# Patient Record
Sex: Female | Born: 1935 | Race: Black or African American | Hispanic: No | Smoking: Former smoker
Health system: Southern US, Community
[De-identification: ages and names within clinical notes are randomized; demographics above are authoritative.]

## PROBLEM LIST (undated history)

## (undated) DIAGNOSIS — I219 Acute myocardial infarction, unspecified: Secondary | ICD-10-CM

## (undated) DIAGNOSIS — K219 Gastro-esophageal reflux disease without esophagitis: Secondary | ICD-10-CM

## (undated) DIAGNOSIS — C50919 Malignant neoplasm of unspecified site of unspecified female breast: Secondary | ICD-10-CM

## (undated) DIAGNOSIS — Z860100 Personal history of colon polyps, unspecified: Secondary | ICD-10-CM

## (undated) DIAGNOSIS — K5792 Diverticulitis of intestine, part unspecified, without perforation or abscess without bleeding: Secondary | ICD-10-CM

## (undated) DIAGNOSIS — L03319 Cellulitis of trunk, unspecified: Secondary | ICD-10-CM

## (undated) DIAGNOSIS — Z9889 Other specified postprocedural states: Secondary | ICD-10-CM

## (undated) DIAGNOSIS — Z87891 Personal history of nicotine dependence: Secondary | ICD-10-CM

## (undated) DIAGNOSIS — H547 Unspecified visual loss: Secondary | ICD-10-CM

## (undated) DIAGNOSIS — E785 Hyperlipidemia, unspecified: Secondary | ICD-10-CM

## (undated) DIAGNOSIS — I2510 Atherosclerotic heart disease of native coronary artery without angina pectoris: Secondary | ICD-10-CM

## (undated) DIAGNOSIS — L02219 Cutaneous abscess of trunk, unspecified: Secondary | ICD-10-CM

## (undated) DIAGNOSIS — C773 Secondary and unspecified malignant neoplasm of axilla and upper limb lymph nodes: Secondary | ICD-10-CM

## (undated) DIAGNOSIS — I10 Essential (primary) hypertension: Secondary | ICD-10-CM

## (undated) DIAGNOSIS — IMO0002 Reserved for concepts with insufficient information to code with codable children: Secondary | ICD-10-CM

## (undated) DIAGNOSIS — N6009 Solitary cyst of unspecified breast: Secondary | ICD-10-CM

## (undated) HISTORY — DX: Essential (primary) hypertension: I10

## (undated) HISTORY — DX: Diverticulitis of intestine, part unspecified, without perforation or abscess without bleeding: K57.92

## (undated) HISTORY — DX: Other specified postprocedural states: Z98.890

## (undated) HISTORY — PX: BACK SURGERY: SHX140

## (undated) HISTORY — DX: Acute myocardial infarction, unspecified: I21.9

## (undated) HISTORY — DX: Cutaneous abscess of trunk, unspecified: L02.219

## (undated) HISTORY — DX: Personal history of colon polyps, unspecified: Z86.0100

## (undated) HISTORY — DX: Personal history of colonic polyps: Z86.010

## (undated) HISTORY — DX: Solitary cyst of unspecified breast: N60.09

## (undated) HISTORY — PX: COLONOSCOPY: SHX174

## (undated) HISTORY — PX: GLAUCOMA SURGERY: SHX656

## (undated) HISTORY — DX: Atherosclerotic heart disease of native coronary artery without angina pectoris: I25.10

## (undated) HISTORY — DX: Secondary and unspecified malignant neoplasm of axilla and upper limb lymph nodes: C77.3

## (undated) HISTORY — DX: Hyperlipidemia, unspecified: E78.5

## (undated) HISTORY — DX: Cellulitis of trunk, unspecified: L03.319

## (undated) HISTORY — DX: Unspecified visual loss: H54.7

## (undated) HISTORY — DX: Personal history of nicotine dependence: Z87.891

## (undated) HISTORY — PX: ANGIOPLASTY: SHX39

## (undated) HISTORY — DX: Malignant neoplasm of unspecified site of unspecified female breast: C50.919

## (undated) HISTORY — DX: Reserved for concepts with insufficient information to code with codable children: IMO0002

## (undated) HISTORY — DX: Gastro-esophageal reflux disease without esophagitis: K21.9

## (undated) HISTORY — PX: EYE SURGERY: SHX253

---

## 1962-05-29 HISTORY — PX: ABDOMINAL HYSTERECTOMY: SHX81

## 2003-03-16 ENCOUNTER — Encounter: Payer: Self-pay | Admitting: Internal Medicine

## 2004-04-15 ENCOUNTER — Ambulatory Visit: Payer: Self-pay | Admitting: Internal Medicine

## 2004-05-04 ENCOUNTER — Ambulatory Visit: Payer: Self-pay | Admitting: Internal Medicine

## 2004-05-29 HISTORY — PX: COLON SURGERY: SHX602

## 2004-06-03 ENCOUNTER — Ambulatory Visit: Payer: Self-pay | Admitting: Orthopedic Surgery

## 2004-07-05 ENCOUNTER — Inpatient Hospital Stay (HOSPITAL_COMMUNITY): Admission: RE | Admit: 2004-07-05 | Discharge: 2004-07-06 | Payer: Self-pay | Admitting: Neurosurgery

## 2004-07-15 ENCOUNTER — Encounter: Payer: Self-pay | Admitting: Internal Medicine

## 2005-01-11 ENCOUNTER — Ambulatory Visit: Payer: Self-pay | Admitting: Internal Medicine

## 2005-05-16 ENCOUNTER — Ambulatory Visit: Payer: Self-pay | Admitting: Internal Medicine

## 2005-10-13 ENCOUNTER — Ambulatory Visit: Payer: Self-pay | Admitting: Internal Medicine

## 2005-11-21 ENCOUNTER — Ambulatory Visit: Payer: Self-pay | Admitting: Internal Medicine

## 2005-11-23 ENCOUNTER — Encounter: Payer: Self-pay | Admitting: Internal Medicine

## 2005-11-23 ENCOUNTER — Ambulatory Visit: Payer: Self-pay | Admitting: Gastroenterology

## 2006-04-16 ENCOUNTER — Ambulatory Visit: Payer: Self-pay | Admitting: Internal Medicine

## 2006-05-17 ENCOUNTER — Ambulatory Visit: Payer: Self-pay | Admitting: Internal Medicine

## 2006-06-27 ENCOUNTER — Ambulatory Visit: Payer: Self-pay | Admitting: Internal Medicine

## 2006-07-12 ENCOUNTER — Ambulatory Visit: Payer: Self-pay | Admitting: Internal Medicine

## 2006-07-16 ENCOUNTER — Encounter: Payer: Self-pay | Admitting: Internal Medicine

## 2006-10-09 DIAGNOSIS — Z860100 Personal history of colon polyps, unspecified: Secondary | ICD-10-CM | POA: Insufficient documentation

## 2006-10-09 DIAGNOSIS — K5730 Diverticulosis of large intestine without perforation or abscess without bleeding: Secondary | ICD-10-CM | POA: Insufficient documentation

## 2006-10-09 DIAGNOSIS — E119 Type 2 diabetes mellitus without complications: Secondary | ICD-10-CM

## 2006-10-09 DIAGNOSIS — E1129 Type 2 diabetes mellitus with other diabetic kidney complication: Secondary | ICD-10-CM | POA: Insufficient documentation

## 2006-10-09 DIAGNOSIS — K219 Gastro-esophageal reflux disease without esophagitis: Secondary | ICD-10-CM

## 2006-10-09 DIAGNOSIS — I10 Essential (primary) hypertension: Secondary | ICD-10-CM | POA: Insufficient documentation

## 2006-10-09 DIAGNOSIS — D126 Benign neoplasm of colon, unspecified: Secondary | ICD-10-CM

## 2006-10-09 DIAGNOSIS — M5137 Other intervertebral disc degeneration, lumbosacral region: Secondary | ICD-10-CM

## 2006-10-09 DIAGNOSIS — H409 Unspecified glaucoma: Secondary | ICD-10-CM | POA: Insufficient documentation

## 2007-02-15 ENCOUNTER — Ambulatory Visit: Payer: Self-pay | Admitting: Internal Medicine

## 2007-02-15 DIAGNOSIS — E785 Hyperlipidemia, unspecified: Secondary | ICD-10-CM | POA: Insufficient documentation

## 2007-02-18 LAB — CONVERTED CEMR LAB
ALT: 12 units/L (ref 0–35)
CO2: 34 meq/L — ABNORMAL HIGH (ref 19–32)
Cholesterol: 104 mg/dL (ref 0–200)
Creatinine, Ser: 0.9 mg/dL (ref 0.4–1.2)
Hgb A1c MFr Bld: 6.7 % — ABNORMAL HIGH (ref 4.6–6.0)
LDL Cholesterol: 44 mg/dL (ref 0–99)
Phosphorus: 4 mg/dL (ref 2.3–4.6)
Potassium: 4.2 meq/L (ref 3.5–5.1)
Sodium: 143 meq/L (ref 135–145)
Total CHOL/HDL Ratio: 4
Triglycerides: 171 mg/dL — ABNORMAL HIGH (ref 0–149)

## 2007-03-01 ENCOUNTER — Encounter: Payer: Self-pay | Admitting: Internal Medicine

## 2007-03-05 ENCOUNTER — Encounter (INDEPENDENT_AMBULATORY_CARE_PROVIDER_SITE_OTHER): Payer: Self-pay | Admitting: *Deleted

## 2007-03-05 LAB — HM MAMMOGRAPHY: HM Mammogram: NORMAL

## 2007-03-26 ENCOUNTER — Telehealth (INDEPENDENT_AMBULATORY_CARE_PROVIDER_SITE_OTHER): Payer: Self-pay | Admitting: *Deleted

## 2007-05-15 ENCOUNTER — Telehealth (INDEPENDENT_AMBULATORY_CARE_PROVIDER_SITE_OTHER): Payer: Self-pay | Admitting: *Deleted

## 2007-07-18 ENCOUNTER — Telehealth (INDEPENDENT_AMBULATORY_CARE_PROVIDER_SITE_OTHER): Payer: Self-pay | Admitting: *Deleted

## 2007-09-02 ENCOUNTER — Telehealth (INDEPENDENT_AMBULATORY_CARE_PROVIDER_SITE_OTHER): Payer: Self-pay | Admitting: *Deleted

## 2007-10-17 ENCOUNTER — Telehealth (INDEPENDENT_AMBULATORY_CARE_PROVIDER_SITE_OTHER): Payer: Self-pay | Admitting: *Deleted

## 2007-12-09 ENCOUNTER — Telehealth (INDEPENDENT_AMBULATORY_CARE_PROVIDER_SITE_OTHER): Payer: Self-pay | Admitting: *Deleted

## 2007-12-10 ENCOUNTER — Ambulatory Visit: Payer: Self-pay | Admitting: Internal Medicine

## 2007-12-10 DIAGNOSIS — N63 Unspecified lump in unspecified breast: Secondary | ICD-10-CM

## 2007-12-12 LAB — CONVERTED CEMR LAB
BUN: 10 mg/dL (ref 6–23)
Basophils Absolute: 0.1 10*3/uL (ref 0.0–0.1)
Bilirubin, Direct: 0.1 mg/dL (ref 0.0–0.3)
Chloride: 99 meq/L (ref 96–112)
Cholesterol: 105 mg/dL (ref 0–200)
Eosinophils Absolute: 0.1 10*3/uL (ref 0.0–0.7)
Eosinophils Relative: 1.6 % (ref 0.0–5.0)
GFR calc Af Amer: 91 mL/min
GFR calc non Af Amer: 75 mL/min
HCT: 40.2 % (ref 36.0–46.0)
HDL: 27.4 mg/dL — ABNORMAL LOW (ref >39.0)
MCHC: 33.4 g/dL (ref 30.0–36.0)
MCV: 92.2 fL (ref 78.0–100.0)
Microalb Creat Ratio: 8.4 mg/g (ref 0.0–30.0)
Microalb, Ur: 1.9 mg/dL (ref 0.0–1.9)
Monocytes Absolute: 0.4 10*3/uL (ref 0.1–1.0)
Platelets: 231 10*3/uL (ref 150–400)
Potassium: 3.7 meq/L (ref 3.5–5.1)
RDW: 13.3 % (ref 11.5–14.6)
Total Bilirubin: 0.7 mg/dL (ref 0.3–1.2)
Total Protein: 7.4 g/dL (ref 6.0–8.3)
Triglycerides: 162 mg/dL — ABNORMAL HIGH (ref 0–149)

## 2008-01-02 ENCOUNTER — Encounter: Payer: Self-pay | Admitting: Internal Medicine

## 2008-01-14 ENCOUNTER — Encounter: Payer: Self-pay | Admitting: Internal Medicine

## 2008-01-17 ENCOUNTER — Encounter: Payer: Self-pay | Admitting: Internal Medicine

## 2008-01-23 ENCOUNTER — Telehealth: Payer: Self-pay | Admitting: Internal Medicine

## 2008-05-18 ENCOUNTER — Telehealth: Payer: Self-pay | Admitting: Internal Medicine

## 2008-05-28 ENCOUNTER — Encounter: Payer: Self-pay | Admitting: Internal Medicine

## 2008-05-29 DIAGNOSIS — J9692 Respiratory failure, unspecified with hypercapnia: Secondary | ICD-10-CM | POA: Diagnosis present

## 2008-05-29 DIAGNOSIS — I219 Acute myocardial infarction, unspecified: Secondary | ICD-10-CM | POA: Diagnosis present

## 2008-05-29 HISTORY — DX: Acute myocardial infarction, unspecified: I21.9

## 2008-05-29 HISTORY — PX: CORONARY ANGIOPLASTY WITH STENT PLACEMENT: SHX49

## 2008-06-12 ENCOUNTER — Ambulatory Visit: Payer: Self-pay | Admitting: Internal Medicine

## 2008-06-12 DIAGNOSIS — M1990 Unspecified osteoarthritis, unspecified site: Secondary | ICD-10-CM | POA: Insufficient documentation

## 2008-06-15 LAB — CONVERTED CEMR LAB
ALT: 12 units/L (ref 0–35)
AST: 19 units/L (ref 0–37)
Albumin: 4 g/dL (ref 3.5–5.2)
Basophils Relative: 0.7 % (ref 0.0–3.0)
Calcium: 9.8 mg/dL (ref 8.4–10.5)
Cholesterol: 96 mg/dL (ref 0–200)
Eosinophils Relative: 1.7 % (ref 0.0–5.0)
GFR calc non Af Amer: 87 mL/min
Hemoglobin: 13.7 g/dL (ref 12.0–15.0)
Hgb A1c MFr Bld: 6.5 % — ABNORMAL HIGH (ref 4.6–6.0)
LDL Cholesterol: 37 mg/dL (ref 0–99)
Lymphocytes Relative: 52.5 % — ABNORMAL HIGH (ref 12.0–46.0)
Monocytes Relative: 8.5 % (ref 3.0–12.0)
Neutro Abs: 2.3 10*3/uL (ref 1.4–7.7)
Phosphorus: 3.2 mg/dL (ref 2.3–4.6)
RBC: 4.37 M/uL (ref 3.87–5.11)
Sodium: 144 meq/L (ref 135–145)
Total CHOL/HDL Ratio: 3.3

## 2008-07-04 ENCOUNTER — Inpatient Hospital Stay: Payer: Self-pay | Admitting: Internal Medicine

## 2008-07-05 ENCOUNTER — Encounter: Payer: Self-pay | Admitting: Internal Medicine

## 2008-07-06 ENCOUNTER — Encounter: Payer: Self-pay | Admitting: Internal Medicine

## 2008-07-07 ENCOUNTER — Encounter: Payer: Self-pay | Admitting: Internal Medicine

## 2008-07-07 DIAGNOSIS — I2510 Atherosclerotic heart disease of native coronary artery without angina pectoris: Secondary | ICD-10-CM | POA: Insufficient documentation

## 2008-07-14 ENCOUNTER — Ambulatory Visit: Payer: Self-pay | Admitting: Internal Medicine

## 2008-07-14 DIAGNOSIS — N390 Urinary tract infection, site not specified: Secondary | ICD-10-CM | POA: Insufficient documentation

## 2008-07-14 LAB — CONVERTED CEMR LAB
Bilirubin Urine: NEGATIVE
Urobilinogen, UA: 0.2
pH: 8

## 2008-07-21 ENCOUNTER — Encounter: Payer: Self-pay | Admitting: Internal Medicine

## 2008-08-04 ENCOUNTER — Telehealth: Payer: Self-pay | Admitting: Family Medicine

## 2008-08-19 ENCOUNTER — Encounter: Payer: Self-pay | Admitting: Cardiovascular Disease

## 2008-08-27 ENCOUNTER — Encounter: Payer: Self-pay | Admitting: Cardiovascular Disease

## 2008-09-16 ENCOUNTER — Encounter: Payer: Self-pay | Admitting: Internal Medicine

## 2008-09-21 ENCOUNTER — Ambulatory Visit: Payer: Self-pay | Admitting: Internal Medicine

## 2008-09-23 ENCOUNTER — Encounter: Payer: Self-pay | Admitting: Internal Medicine

## 2008-09-26 ENCOUNTER — Encounter: Payer: Self-pay | Admitting: Cardiovascular Disease

## 2008-10-01 ENCOUNTER — Encounter: Payer: Self-pay | Admitting: Internal Medicine

## 2008-10-09 ENCOUNTER — Encounter: Payer: Self-pay | Admitting: Internal Medicine

## 2008-10-30 ENCOUNTER — Telehealth: Payer: Self-pay | Admitting: Internal Medicine

## 2008-12-24 ENCOUNTER — Ambulatory Visit: Payer: Self-pay | Admitting: Internal Medicine

## 2008-12-28 LAB — CONVERTED CEMR LAB
Albumin: 4 g/dL (ref 3.5–5.2)
Basophils Relative: 0.8 % (ref 0.0–3.0)
Bilirubin, Direct: 0 mg/dL (ref 0.0–0.3)
Chloride: 106 meq/L (ref 96–112)
Eosinophils Relative: 2.3 % (ref 0.0–5.0)
HCT: 39.5 % (ref 36.0–46.0)
HDL: 35 mg/dL — ABNORMAL LOW (ref >39.00)
Hemoglobin: 13.4 g/dL (ref 12.0–15.0)
LDL Cholesterol: 52 mg/dL (ref 0–99)
Lymphs Abs: 3.3 10*3/uL (ref 0.7–4.0)
Monocytes Relative: 10.3 % (ref 3.0–12.0)
Neutro Abs: 2 10*3/uL (ref 1.4–7.7)
Phosphorus: 4.3 mg/dL (ref 2.3–4.6)
Potassium: 4.3 meq/L (ref 3.5–5.1)
RBC: 4.39 M/uL (ref 3.87–5.11)
RDW: 13.9 % (ref 11.5–14.6)
Sodium: 142 meq/L (ref 135–145)
Total Bilirubin: 0.9 mg/dL (ref 0.3–1.2)
Total CHOL/HDL Ratio: 3
VLDL: 27.2 mg/dL (ref 0.0–40.0)

## 2009-01-15 ENCOUNTER — Encounter: Payer: Self-pay | Admitting: Internal Medicine

## 2009-01-15 ENCOUNTER — Observation Stay: Payer: Self-pay | Admitting: Internal Medicine

## 2009-01-16 ENCOUNTER — Encounter: Payer: Self-pay | Admitting: Internal Medicine

## 2009-01-19 ENCOUNTER — Encounter: Payer: Self-pay | Admitting: Internal Medicine

## 2009-01-20 ENCOUNTER — Encounter: Payer: Self-pay | Admitting: Internal Medicine

## 2009-01-29 ENCOUNTER — Telehealth: Payer: Self-pay | Admitting: Family Medicine

## 2009-02-05 ENCOUNTER — Ambulatory Visit: Payer: Self-pay | Admitting: Internal Medicine

## 2009-02-09 ENCOUNTER — Encounter: Payer: Self-pay | Admitting: Internal Medicine

## 2009-02-09 ENCOUNTER — Ambulatory Visit: Payer: Self-pay | Admitting: Ophthalmology

## 2009-02-17 ENCOUNTER — Ambulatory Visit: Payer: Self-pay | Admitting: Ophthalmology

## 2009-02-20 ENCOUNTER — Ambulatory Visit: Payer: Self-pay | Admitting: Ophthalmology

## 2009-03-08 ENCOUNTER — Encounter: Payer: Self-pay | Admitting: Internal Medicine

## 2009-04-01 ENCOUNTER — Telehealth: Payer: Self-pay | Admitting: Internal Medicine

## 2009-05-05 ENCOUNTER — Telehealth: Payer: Self-pay | Admitting: Internal Medicine

## 2009-05-29 DIAGNOSIS — H547 Unspecified visual loss: Secondary | ICD-10-CM

## 2009-05-29 HISTORY — PX: BREAST SURGERY: SHX581

## 2009-05-29 HISTORY — DX: Unspecified visual loss: H54.7

## 2009-06-17 LAB — HM DIABETES EYE EXAM

## 2009-06-24 ENCOUNTER — Telehealth: Payer: Self-pay | Admitting: Internal Medicine

## 2009-06-28 ENCOUNTER — Ambulatory Visit: Payer: Self-pay | Admitting: Internal Medicine

## 2009-07-01 LAB — CONVERTED CEMR LAB
ALT: 12 units/L (ref 0–35)
AST: 17 units/L (ref 0–37)
Albumin: 3.9 g/dL (ref 3.5–5.2)
BUN: 9 mg/dL (ref 6–23)
Basophils Absolute: 0 10*3/uL (ref 0.0–0.1)
Basophils Relative: 0.8 % (ref 0.0–3.0)
Bilirubin, Direct: 0.1 mg/dL (ref 0.0–0.3)
CO2: 29 meq/L (ref 19–32)
Chloride: 107 meq/L (ref 96–112)
Eosinophils Absolute: 0.1 10*3/uL (ref 0.0–0.7)
HCT: 40.6 % (ref 36.0–46.0)
HDL: 36.7 mg/dL — ABNORMAL LOW (ref >39.00)
Hemoglobin: 13.1 g/dL (ref 12.0–15.0)
Lymphocytes Relative: 42.9 % (ref 12.0–46.0)
Lymphs Abs: 2.5 10*3/uL (ref 0.7–4.0)
MCHC: 32.3 g/dL (ref 30.0–36.0)
Monocytes Relative: 8.7 % (ref 3.0–12.0)
Neutro Abs: 2.8 10*3/uL (ref 1.4–7.7)
Potassium: 3.8 meq/L (ref 3.5–5.1)
RBC: 4.35 M/uL (ref 3.87–5.11)
RDW: 13.6 % (ref 11.5–14.6)
Total Bilirubin: 0.5 mg/dL (ref 0.3–1.2)
Total CHOL/HDL Ratio: 3
Total Protein: 7.2 g/dL (ref 6.0–8.3)

## 2009-07-28 ENCOUNTER — Ambulatory Visit: Payer: Self-pay | Admitting: Gastroenterology

## 2009-07-28 ENCOUNTER — Encounter: Payer: Self-pay | Admitting: Internal Medicine

## 2009-07-28 LAB — HM COLONOSCOPY

## 2009-08-16 ENCOUNTER — Ambulatory Visit: Payer: Self-pay | Admitting: Internal Medicine

## 2009-08-16 DIAGNOSIS — H9190 Unspecified hearing loss, unspecified ear: Secondary | ICD-10-CM | POA: Insufficient documentation

## 2009-08-23 ENCOUNTER — Telehealth: Payer: Self-pay | Admitting: Internal Medicine

## 2009-10-07 ENCOUNTER — Telehealth: Payer: Self-pay | Admitting: Internal Medicine

## 2009-10-23 ENCOUNTER — Emergency Department: Payer: Self-pay | Admitting: Emergency Medicine

## 2009-10-28 ENCOUNTER — Ambulatory Visit: Payer: Self-pay | Admitting: Family Medicine

## 2009-10-28 DIAGNOSIS — R109 Unspecified abdominal pain: Secondary | ICD-10-CM | POA: Insufficient documentation

## 2009-10-29 ENCOUNTER — Ambulatory Visit: Payer: Self-pay | Admitting: Family Medicine

## 2009-10-29 ENCOUNTER — Telehealth (INDEPENDENT_AMBULATORY_CARE_PROVIDER_SITE_OTHER): Payer: Self-pay | Admitting: *Deleted

## 2009-10-29 LAB — CONVERTED CEMR LAB: Creatinine, Ser: 0.7 mg/dL (ref 0.4–1.2)

## 2009-11-03 ENCOUNTER — Ambulatory Visit: Payer: Self-pay | Admitting: Family Medicine

## 2009-11-03 ENCOUNTER — Encounter: Payer: Self-pay | Admitting: Family Medicine

## 2009-11-25 ENCOUNTER — Encounter: Payer: Self-pay | Admitting: Internal Medicine

## 2009-11-26 ENCOUNTER — Ambulatory Visit: Payer: Self-pay | Admitting: Internal Medicine

## 2009-11-30 LAB — CONVERTED CEMR LAB: Hgb A1c MFr Bld: 6.2 % (ref 4.6–6.5)

## 2009-12-02 ENCOUNTER — Encounter: Payer: Self-pay | Admitting: Internal Medicine

## 2009-12-10 ENCOUNTER — Telehealth: Payer: Self-pay | Admitting: Internal Medicine

## 2009-12-10 ENCOUNTER — Encounter: Payer: Self-pay | Admitting: Internal Medicine

## 2010-01-24 ENCOUNTER — Telehealth: Payer: Self-pay | Admitting: Internal Medicine

## 2010-03-25 ENCOUNTER — Telehealth: Payer: Self-pay | Admitting: Internal Medicine

## 2010-04-26 ENCOUNTER — Encounter: Payer: Self-pay | Admitting: Internal Medicine

## 2010-05-24 ENCOUNTER — Telehealth: Payer: Self-pay | Admitting: Internal Medicine

## 2010-05-29 HISTORY — PX: BREAST BIOPSY: SHX20

## 2010-05-29 HISTORY — PX: MASTECTOMY: SHX3

## 2010-06-10 ENCOUNTER — Other Ambulatory Visit: Payer: Self-pay | Admitting: Internal Medicine

## 2010-06-10 ENCOUNTER — Ambulatory Visit
Admission: RE | Admit: 2010-06-10 | Discharge: 2010-06-10 | Payer: Self-pay | Source: Home / Self Care | Attending: Internal Medicine | Admitting: Internal Medicine

## 2010-06-10 DIAGNOSIS — K5909 Other constipation: Secondary | ICD-10-CM | POA: Insufficient documentation

## 2010-06-10 LAB — TSH: TSH: 0.89 u[IU]/mL (ref 0.35–5.50)

## 2010-06-10 LAB — RENAL FUNCTION PANEL
Albumin: 4 g/dL (ref 3.5–5.2)
BUN: 11 mg/dL (ref 6–23)
CO2: 29 mEq/L (ref 19–32)
Calcium: 9.7 mg/dL (ref 8.4–10.5)
Chloride: 104 mEq/L (ref 96–112)
Creatinine, Ser: 0.7 mg/dL (ref 0.4–1.2)
GFR: 108.34 mL/min (ref >60.00)
Glucose, Bld: 139 mg/dL — ABNORMAL HIGH (ref 70–99)
Phosphorus: 3.1 mg/dL (ref 2.3–4.6)
Potassium: 4.6 mEq/L (ref 3.5–5.1)
Sodium: 142 mEq/L (ref 135–145)

## 2010-06-10 LAB — LIPID PANEL
Cholesterol: 209 mg/dL — ABNORMAL HIGH (ref 0–200)
HDL: 36.7 mg/dL — ABNORMAL LOW (ref >39.00)
Total CHOL/HDL Ratio: 6
Triglycerides: 250 mg/dL — ABNORMAL HIGH (ref 0.0–149.0)
VLDL: 50 mg/dL — ABNORMAL HIGH (ref 0.0–40.0)

## 2010-06-10 LAB — CBC WITH DIFFERENTIAL/PLATELET
Basophils Absolute: 0 10*3/uL (ref 0.0–0.1)
Basophils Relative: 0.5 % (ref 0.0–3.0)
Eosinophils Absolute: 0.1 10*3/uL (ref 0.0–0.7)
Eosinophils Relative: 1.3 % (ref 0.0–5.0)
HCT: 40.4 % (ref 36.0–46.0)
Hemoglobin: 13.6 g/dL (ref 12.0–15.0)
Lymphocytes Relative: 36 % (ref 12.0–46.0)
Lymphs Abs: 3.3 10*3/uL (ref 0.7–4.0)
MCHC: 33.6 g/dL (ref 30.0–36.0)
MCV: 90.7 fl (ref 78.0–100.0)
Monocytes Absolute: 0.4 10*3/uL (ref 0.1–1.0)
Monocytes Relative: 4.3 % (ref 3.0–12.0)
Neutro Abs: 5.2 10*3/uL (ref 1.4–7.7)
Neutrophils Relative %: 57.9 % (ref 43.0–77.0)
Platelets: 215 10*3/uL (ref 150.0–400.0)
RBC: 4.46 Mil/uL (ref 3.87–5.11)
RDW: 15.1 % — ABNORMAL HIGH (ref 11.5–14.6)
WBC: 9 10*3/uL (ref 4.5–10.5)

## 2010-06-10 LAB — HEPATIC FUNCTION PANEL
ALT: 14 U/L (ref 0–35)
AST: 18 U/L (ref 0–37)
Albumin: 4 g/dL (ref 3.5–5.2)
Alkaline Phosphatase: 43 U/L (ref 39–117)
Bilirubin, Direct: 0.1 mg/dL (ref 0.0–0.3)
Total Bilirubin: 0.4 mg/dL (ref 0.3–1.2)
Total Protein: 7.5 g/dL (ref 6.0–8.3)

## 2010-06-10 LAB — HEMOGLOBIN A1C: Hgb A1c MFr Bld: 6.6 % — ABNORMAL HIGH (ref 4.6–6.5)

## 2010-06-10 LAB — LDL CHOLESTEROL, DIRECT: Direct LDL: 131.1 mg/dL

## 2010-06-15 ENCOUNTER — Encounter: Payer: Self-pay | Admitting: Internal Medicine

## 2010-06-28 NOTE — Progress Notes (Signed)
Summary: Rx Hydrocodone/APAP  Phone Note Refill Request Call back at [phone] [facility] on August 23, 2009 9:12 AM  Refills Requested: Medication #1:  HYDROCODONE-ACETAMINOPHEN 5-325 MG TABS take 1 tablet by mouth four times a day as needed pain   Last Refilled: 07/19/2009 Received faxed refill request, please advise   Method Requested: Telephone to Pharmacy Initial call taken by: Linde Gillis CMA Duncan Dull),  August 23, 2009 9:13 AM  Follow-up for Phone Call        okay #60 x 1 Follow-up by: Cindee Salt MD,  August 23, 2009 1:54 PM  Additional Follow-up for Phone Call Additional follow up Details #1::        [phone] Additional Follow-up by: Mervin Hack CMA Duncan Dull),  August 23, 2009 2:19 PM    Prescriptions: HYDROCODONE-ACETAMINOPHEN 5-325 MG TABS (HYDROCODONE-ACETAMINOPHEN) take 1 tablet by mouth four times a day as needed pain  #60 x 1   Entered by:   Mervin Hack CMA (AAMA)   Authorized by:   Cindee Salt MD   Signed by:   Mervin Hack CMA (AAMA) on 08/23/2009   Method used:   Handwritten   RxID:   [id number]

## 2010-06-28 NOTE — Assessment & Plan Note (Signed)
Summary: CHECK HEARING/CLE   Vital Signs:  Patient profile:   77 year old female Weight:      185 pounds Temp:     98.4 degrees F oral Pulse rate:   80 / minute Pulse rhythm:   regular BP sitting:   160 / 80  (left arm) Cuff size:   large  Vitals Entered By: Mervin Hack CMA Duncan Dull) (August 16, 2009 2:35 PM) CC: check hearing? 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  Past Medical History: Reviewed history from 07/06/2008 and no changes required. Colonic polyps, hx of--tubular adenomas Diabetes mellitus, type II Diverticulosis, colon GERD Hypertension Glaucoma Degenerative disc disease Hyperlipidemia Osteoarthritis Coronary artery disease  Past Surgical History: Reviewed history from 06/28/2009 and no changes required. Vaginal hysterectomy 1964 Breast cyst 2001 Glaucoma surgery  01/03-03/03 3 Level lumbar decompressive laminotomies with  foraminotomies 02/06 2/10 Angioplasty/stent-RCA---------Dr Arida Right cataract & glaucoma surgery---Dr Inez Pilgrim  4/10  Family History: Reviewed history from 10/09/2006 and no changes required. Dad died of lung cancer Brother died of kidney cancer Very strong FH of colon cancer  (at least 2 of children)  Social History: Reviewed history from 10/09/2006 and no changes required. Widowed --5 children Retired as hospital dietary supvr Current Smoker Alcohol use-no Forensic psychologist Mom  Review of Systems       No fever no cold symptoms  Physical Exam  General:  alert.  NAD Ears:  R ear normal and L ear normal.   No sig cerumen Weber doesn't lateralize Rhinne shows air > bone conduction bilaterally   Impression & Recommendations:  Problem # 1:  DECREASED HEARING (ICD-389.9) Assessment New doesn't seem to be a major issue at this point the pressure sensation may be from pushing some cerumen in canal discussed  Complete Medication List: 1)  Glipizide 5 Mg Tabs (Glipizide) .... Take one by mouth daily 2)  Simvastatin 40 Mg Tabs (Simvastatin) .... Take one by mouth daily 3)  Travatan 0.004 % Soln (Travoprost) 4)  Hydrocodone-acetaminophen 5-325 Mg Tabs (Hydrocodone-acetaminophen) .... Take 1 tablet by mouth four times a day as needed pain 5)  Plavix 75 Mg Tabs (Clopidogrel bisulfate) .... Take 1 tablet by mouth once daily 6)  Adult Aspirin Ec Low Strength 81 Mg Tbec (Aspirin) .... Take 1 by mouth once daily 7)  Amlodipine Besy-benazepril Hcl 5-20 Mg Caps (Amlodipine besy-benazepril hcl) .... Take one by mouth daily 8)  Fish Oil 1000 Mg Caps (Omega-3 fatty acids) .... Take 1-2 by mouth daily 9)  Metoprolol Tartrate 100 Mg Tabs (Metoprolol tartrate) .... Take 1 by mouth two times a day 10)  Methazolamide 25 Mg Tabs (Methazolamide) .... Take 1 by  mouth once daily 11)  Dorzolamide Hcl 2 % Soln (Dorzolamide hcl) .... Use in left eye before breakfast and dinner. 12)  Combigan 0.2-0.5 % Soln (Brimonidine tartrate-timolol) .Marland Kitchen.. 1 drop in each eye before breakfast and dinner  Patient Instructions: 1)  Keep regular follow up visit  Current Allergies (reviewed today): No known allergies

## 2010-06-28 NOTE — Assessment & Plan Note (Signed)
Summary: 6 MONTH FOLLOW UP   Vital Signs:  Patient profile:   77 year old female Weight:      187 pounds Temp:     97.4 degrees F oral Pulse rate:   64 / minute Pulse rhythm:   regular BP sitting:   142 / 70  (left arm) Cuff size:   large  Vitals Entered By: Lowella Petties CMA (November 26, 2009 11:12 AM) CC: 6 month follow up   History of Present Illness: here with [name] Vision is very poor--can only see shadows Lives wtih daughter  Still having stomach trouble gets "jumpy, sick feeling all the time" certain foods and perhaps her medicine bring it on  Diabetes has been fine not checking due to vision issues No sig hypoglycemic reactions No sores or pain in feet  Heart has been fine No chest pain No SOB No edema  Still troubled with arthritis --mostly in legs uses hydrocodone three times a day usually  Allergies: No Known Drug Allergies  Past History:  Past medical, surgical, family and social histories (including risk factors) reviewed for relevance to current acute and chronic problems.  Past Medical History: Reviewed history from 07/06/2008 and no changes required. Colonic polyps, hx of--tubular adenomas Diabetes mellitus, type II Diverticulosis, colon GERD Hypertension Glaucoma Degenerative disc disease Hyperlipidemia Osteoarthritis Coronary artery disease  Past Surgical History: Reviewed history from 06/28/2009 and no changes required. Vaginal hysterectomy 1964 Breast cyst 2001 Glaucoma surgery  01/03-03/03 3 Level lumbar decompressive laminotomies with  foraminotomies 02/06 2/10 Angioplasty/stent-RCA---------Dr Arida Right cataract & glaucoma surgery---Dr Inez Pilgrim  4/10  Family History: Reviewed history from 10/09/2006 and no changes required. Dad died of lung cancer Brother died of kidney cancer Very strong FH of colon cancer (at least 2 of children)  Social History: Reviewed history from 10/09/2006 and no changes required. 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   Neck:  supple, no masses, no thyromegaly, no carotid bruits, and no cervical lymphadenopathy.   Lungs:  normal respiratory effort and normal breath sounds.   Heart:  normal rate, regular rhythm, no murmur, and no gallop.   Abdomen:  soft and non-tender.   Pulses:  1+ in feet Extremities:  no edema Skin:  no suspicious lesions and no ulcerations.   Psych:  normally interactive, good eye contact, not anxious appearing, and not depressed appearing.    Diabetes Management Exam:    Foot Exam (with socks and/or shoes not present):       Sensory-Pinprick/Light touch:          Left medial foot (L-4): normal          Left dorsal foot (L-5): normal          Left lateral foot (S-1): normal          Right medial foot (L-4): normal          Right dorsal foot (L-5): normal          Right lateral foot (S-1): normal       Inspection:          Left foot: normal          Right foot: normal       Nails:  Left foot: thickened          Right foot: thickened   Impression & Recommendations:  Problem # 1:  ABDOMINAL PAIN OTHER SPECIFIED SITE (ICD-789.09) Assessment Unchanged on going will stop simvastatin in case that is part of the problem in crease ranitidine  Her updated medication list for this problem includes:    Hydrocodone-acetaminophen 5-325 Mg Tabs (Hydrocodone-acetaminophen) .Marland Kitchen... Take 1 tablet by mouth four times a day as needed pain    Adult Aspirin Ec Low Strength 81 Mg Tbec (Aspirin) .Marland Kitchen... Take 1 by mouth once daily  Problem # 2:  DIABETES MELLITUS, TYPE II (ICD-250.00) Assessment: Unchanged  hopefully still has good control  Her updated medication list for this problem includes:    Glipizide 5 Mg  Tabs (Glipizide) .Marland Kitchen... Take one by mouth daily    Amlodipine Besy-benazepril Hcl 5-20 Mg Caps (Amlodipine besy-benazepril hcl) .Marland Kitchen... Take one by mouth daily    Adult Aspirin Ec Low Strength 81 Mg Tbec (Aspirin) .Marland Kitchen... Take 1 by mouth once daily  Labs Reviewed: Creat: 0.7 (10/29/2009)     Last Eye Exam: serious non diabetic eye disease (06/17/2009) Reviewed HgBA1c results: 6.5 (06/28/2009)  6.3 (12/24/2008)  Orders: Venipuncture (62130) TLB-A1C / Hgb A1C (Glycohemoglobin) (83036-A1C)  Problem # 3:  HYPERLIPIDEMIA (ICD-272.4) Assessment: Comment Only will try off statin since LDL was only 34 and could be related to GI problems  The following medications were removed from the medication list:    Simvastatin 40 Mg Tabs (Simvastatin) .Marland Kitchen... Take one by mouth daily  Labs Reviewed: SGOT: 17 (06/28/2009)   SGPT: 12 (06/28/2009)   HDL:36.70 (06/28/2009), 35.00 (12/24/2008)  LDL:34 (06/28/2009), 52 (12/24/2008)  Chol:99 (06/28/2009), 114 (12/24/2008)  Trig:140.0 (06/28/2009), 136.0 (12/24/2008)  Problem # 4:  HYPERTENSION (ICD-401.9) Assessment: Unchanged reasonable control no changes  The following medications were removed from the medication list:    Methazolamide 25 Mg Tabs (Methazolamide) .Marland Kitchen... Take 1 by mouth once daily Her updated medication list for this problem includes:    Amlodipine Besy-benazepril Hcl 5-20 Mg Caps (Amlodipine besy-benazepril hcl) .Marland Kitchen... Take one by mouth daily    Metoprolol Tartrate 100 Mg Tabs (Metoprolol tartrate) .Marland Kitchen... Take 1 by mouth two times a day  BP today: 142/70 Prior BP: 120/80 (10/28/2009)  Labs Reviewed: K+: 3.8 (06/28/2009) Creat: : 0.7 (10/29/2009)   Chol: 99 (06/28/2009)   HDL: 36.70 (06/28/2009)   LDL: 34 (06/28/2009)   TG: 140.0 (06/28/2009)  Problem # 5:  OSTEOARTHRITIS (ICD-715.90) Assessment: Comment Only will approve higher number of hydrocodone  Her updated medication list for this problem includes:    Hydrocodone-acetaminophen  5-325 Mg Tabs (Hydrocodone-acetaminophen) .Marland Kitchen... Take 1 tablet by mouth four times a day as needed pain    Adult Aspirin Ec Low Strength 81 Mg Tbec (Aspirin) .Marland Kitchen... Take 1 by mouth once daily  Problem # 6:  CORONARY ARTERY DISEASE (ICD-414.00) Assessment: Comment Only has been quiet  The following medications were removed from the medication list:    Methazolamide 25 Mg Tabs (Methazolamide) .Marland Kitchen... Take 1 by mouth once daily Her updated medication list for this problem includes:    Plavix 75 Mg Tabs (Clopidogrel bisulfate) .Marland Kitchen... Take 1 tablet by mouth once daily    Amlodipine Besy-benazepril Hcl 5-20 Mg Caps (Amlodipine besy-benazepril hcl) .Marland Kitchen... Take one by mouth daily    Metoprolol Tartrate 100 Mg Tabs (Metoprolol tartrate) .Marland Kitchen... Take 1 by mouth two times a day    Adult Aspirin Ec Low Strength 81 Mg Tbec (Aspirin) .Marland Kitchen... Take 1  by mouth once daily  Complete Medication List: 1)  Glipizide 5 Mg Tabs (Glipizide) .... Take one by mouth daily 2)  Hydrocodone-acetaminophen 5-325 Mg Tabs (Hydrocodone-acetaminophen) .... Take 1 tablet by mouth four times a day as needed pain 3)  Plavix 75 Mg Tabs (Clopidogrel bisulfate) .... Take 1 tablet by mouth once daily 4)  Amlodipine Besy-benazepril Hcl 5-20 Mg Caps (Amlodipine besy-benazepril hcl) .... Take one by mouth daily 5)  Metoprolol Tartrate 100 Mg Tabs (Metoprolol tartrate) .... Take 1 by mouth two times a day 6)  Ranitidine Hcl 300 Mg Tabs (Ranitidine hcl) .Marland Kitchen.. 1 tab by mouth two times a day for stomach trouble 7)  Adult Aspirin Ec Low Strength 81 Mg Tbec (Aspirin) .... Take 1 by mouth once daily 8)  Combigan 0.2-0.5 % Soln (Brimonidine tartrate-timolol) .Marland Kitchen.. 1 drop in each eye before breakfast and dinner 9)  Travatan 0.004 % Soln (Travoprost)  Other Orders: Radiology Referral (Radiology)  Patient Instructions: 1)  Please stop the fish oil and simvastatin.  2)  Please increase the ranitidine to 300mg  two times a day  3)  Please schedule a  follow-up appointment in 6 months .  4)  Schedule your mammogram.  Prescriptions: RANITIDINE HCL 300 MG TABS (RANITIDINE HCL) 1 tab by mouth two times a day for stomach trouble  #60 x 12   Entered and Authorized by:   Cindee Salt MD   Signed by:   Cindee Salt MD on 11/26/2009   Method used:   [facility]        [id number]ail[facility]       [address] [place], [place]  [address]       Ph: [phone] or [phone]       Fax: [phone]   RxID:   [id number] HYDROCODONE-ACETAMINOPHEN 5-325 MG TABS (HYDROCODONE-ACETAMINOPHEN) take 1 tablet by mouth four times a day as needed pain  #120 x 0   Entered and Authorized by:   Cindee Salt MD   Signed by:   Cindee Salt MD on 11/26/2009   Method used:   Print then Give to Patient   RxID:   [id number]   Prior Medications (reviewed today): GLIPIZIDE 5 MG TABS (GLIPIZIDE) Take one by mouth daily HYDROCODONE-ACETAMINOPHEN 5-325 MG TABS (HYDROCODONE-ACETAMINOPHEN) take 1 tablet by mouth four times a day as needed pain PLAVIX 75 MG TABS (CLOPIDOGREL BISULFATE) take 1 tablet by mouth once daily AMLODIPINE BESY-BENAZEPRIL HCL 5-20 MG CAPS (AMLODIPINE BESY-BENAZEPRIL HCL) Take one by mouth daily METOPROLOL TARTRATE 100 MG TABS (METOPROLOL TARTRATE) take 1 by mouth two times a day ADULT ASPIRIN EC LOW STRENGTH 81 MG TBEC (ASPIRIN) take 1 by mouth once daily COMBIGAN 0.2-0.5 % SOLN (BRIMONIDINE TARTRATE-TIMOLOL) 1 drop in each eye before breakfast and dinner TRAVATAN 0.004 %  SOLN (TRAVOPROST)  Current Allergies: No known allergies

## 2010-06-28 NOTE — Progress Notes (Signed)
Summary: refill request for vicodin  Phone Note Refill Request Message from:  Fax from Pharmacy  Refills Requested: Medication #1:  HYDROCODONE-ACETAMINOPHEN 5-325 MG TABS take 1 tablet by mouth four times a day as needed pain   Last Refilled: 11/26/2009 [facility] [address] [facility] on [facility] desk.  Initial call taken by: Lowella Petties CMA,  January 24, 2010 2:36 PM  Follow-up for Phone Call        okay #120 x 0 Follow-up by: Cindee Salt MD,  January 24, 2010 6:22 PM  Additional Follow-up for Phone Call Additional follow up Details #1::        Rx called to pharmacy Additional Follow-up by: DeShannon Smith CMA Duncan Dull),  January 25, 2010 8:27 AM    Prescriptions: HYDROCODONE-ACETAMINOPHEN 5-325 MG TABS (HYDROCODONE-ACETAMINOPHEN) take 1 tablet by mouth four times a day as needed pain  #120 x 0   Entered by:   Mervin Hack CMA (AAMA)   Authorized by:   Cindee Salt MD   Signed by:   Mervin Hack CMA (AAMA) on 01/25/2010   Method used:   Handwritten   RxID:   [id number]

## 2010-06-28 NOTE — Progress Notes (Signed)
Summary: refill request for vicodin  Phone Note Refill Request Message from:  Fax from Pharmacy  Refills Requested: Medication #1:  HYDROCODONE-ACETAMINOPHEN 5-325 MG TABS take 1 tablet by mouth four times a day as needed pain   Last Refilled: 01/25/2010 [facility] [address] [facility]  Initial call taken by: Lowella Petties CMA, AAMA,  March 25, 2010 8:04 AM  Follow-up for Phone Call        Okay #120 x 0 Follow-up by: Cindee Salt MD,  March 25, 2010 8:59 AM  Additional Follow-up for Phone Call Additional follow up Details #1::        Rx faxed to pharmacy Additional Follow-up by: DeShannon Smith CMA Duncan Dull),  March 25, 2010 12:00 PM    Prescriptions: HYDROCODONE-ACETAMINOPHEN 5-325 MG TABS (HYDROCODONE-ACETAMINOPHEN) take 1 tablet by mouth four times a day as needed pain  #120 x 0   Entered by:   Mervin Hack CMA (AAMA)   Authorized by:   Cindee Salt MD   Signed by:   Mervin Hack CMA (AAMA) on 03/25/2010   Method used:   Handwritten   RxID:   [id number]

## 2010-06-28 NOTE — Progress Notes (Signed)
  Phone Note From Other Clinic   Caller: Daughter- Mardelle Matte Summary of Call: Patient had screening MMG on 11/30/2009 at [facility], she was called back for addl views of right breast today 12/10/2009. She was told to contact her Dr for further evaluation of what was seen today by the Radiologist. I had to call  Imaging to request that they send [place] the reports B/C we did not have them received yet. Daughter, Mardelle Matte called to ask you to refer her mom back to Dr Lemar Livings, she had similar problem with the right breast in the past and wants to go back to see him. Told her we would call her next week after we recived the reports.  Initial call taken by: Carlton Adam,  December 10, 2009 3:53 PM  Follow-up for Phone Call        referral made Follow-up by: Cindee Salt MD,  December 11, 2009 5:41 PM

## 2010-06-28 NOTE — Procedures (Signed)
Summary: Colonoscopy by Dr.Paul [facility]  Colonoscopy by Dr.Paul Oh,[facility]   Imported By: Beau Fanny 08/04/2009 10:38:51  _____________________________________________________________________  External Attachment:    Type:   Image     Comment:   External Document  Appended Document: Colonoscopy by Dr.Paul [facility]    Clinical Lists Changes  Observations: Added new observation of COLONOSCOPY: Results: Diverticulosis.   No polyps Dr Bluford Kaufmann (07/28/2009 12:08)       Colonoscopy  Procedure date:  07/28/2009  Findings:      Results: Diverticulosis.   No polyps Dr Bluford Kaufmann

## 2010-06-28 NOTE — Assessment & Plan Note (Signed)
Summary: STOMACH/DLO   Vital Signs:  Patient profile:   77 year old female Height:      65 inches Weight:      183.38 pounds BMI:     30.63 Temp:     98.6 degrees F oral Pulse rate:   60 / minute Pulse rhythm:   regular BP sitting:   120 / 80  (right arm) Cuff size:   large  Vitals Entered By: Linde Gillis CMA Duncan Dull) (October 28, 2009 12:15 PM) CC: stomach   History of Present Illness: 77 yo female new to me with abdominal cramping and pain.  Has a h/o diverticulosis. Over past month, progressively worsening abdominal cramping/pain, nausea.  Feels it particularly after eating.  Usually LLQ but also points to RLQ at times. Is sometimes associated with nausea, but usually is not. Unsure if there is blood in her stool (pt is blind). Has had some increase in bowel movements at times.  Pt is here with daughter who is unable to help with details of her complaint. She does not think she has had fevers. Taking more Hydrocodone because it helps with this pain.  Current Medications (verified): 1)  Glipizide 5 Mg Tabs (Glipizide) .... Take One By Mouth Daily 2)  Simvastatin 40 Mg Tabs (Simvastatin) .... Take One By Mouth Daily 3)  Travatan 0.004 %  Soln (Travoprost) 4)  Hydrocodone-Acetaminophen 5-325 Mg Tabs (Hydrocodone-Acetaminophen) .... Take 1 Tablet By Mouth Four Times A Day As Needed Pain 5)  Plavix 75 Mg Tabs (Clopidogrel Bisulfate) .... Take 1 Tablet By Mouth Once Daily 6)  Adult Aspirin Ec Low Strength 81 Mg Tbec (Aspirin) .... Take 1 By Mouth Once Daily 7)  Amlodipine Besy-Benazepril Hcl 5-20 Mg Caps (Amlodipine Besy-Benazepril Hcl) .... Take One By Mouth Daily 8)  Fish Oil 1000 Mg Caps (Omega-3 Fatty Acids) .... Take 1-2 By Mouth Daily 9)  Metoprolol Tartrate 100 Mg Tabs (Metoprolol Tartrate) .... Take 1 By Mouth Two Times A Day 10)  Methazolamide 25 Mg Tabs (Methazolamide) .... Take 1 By Mouth Once Daily 11)  Dorzolamide Hcl 2 % Soln (Dorzolamide Hcl) .... Use in Left Eye  Before Breakfast and Dinner. 12)  Combigan 0.2-0.5 % Soln (Brimonidine Tartrate-Timolol) .Marland Kitchen.. 1 Drop in Each Eye Before Breakfast and Dinner 13)  Cipro 500 Mg Tabs (Ciprofloxacin Hcl) .Marland Kitchen.. 1 By Mouth 2 Times Daily X 10 Days 14)  Flagyl 500 Mg Tabs (Metronidazole) .Marland Kitchen.. 1 Tab By Mouth Three Times A Day X 10 Days  Allergies (verified): No Known Drug Allergies  Past History:  Past Medical History: Last updated: 07/06/2008 Colonic polyps, hx of--tubular adenomas Diabetes mellitus, type II Diverticulosis, colon GERD Hypertension Glaucoma Degenerative disc disease Hyperlipidemia Osteoarthritis Coronary artery disease  Past Surgical History: Last updated: 06/28/2009 Vaginal hysterectomy 1964 Breast cyst 2001 Glaucoma surgery  01/03-03/03 3 Level lumbar decompressive laminotomies with  foraminotomies 02/06 2/10 Angioplasty/stent-RCA---------Dr Arida Right cataract & glaucoma surgery---Dr Brasington  4/10  Review of Systems      See HPI General:  Denies chills and fever. GI:  Complains of abdominal pain and nausea; denies bloody stools, diarrhea, and vomiting.  Physical Exam  General:  alert.  NAD Abdomen:  soft, mildy tender in RLQ. No rebound or guarding. Psych:  normally interactive, good eye contact, not anxious appearing, and not depressed appearing.     Impression & Recommendations:  Problem # 1:  ABDOMINAL PAIN OTHER SPECIFIED SITE (ICD-789.09) Assessment New Pt is poor historian with progressive symptoms in with known h/o  diverticulosis, will start cipro and flagyl to cover for diverticulitis and send for CT of abdomen/pelvix. Her updated medication list for this problem includes:    Hydrocodone-acetaminophen 5-325 Mg Tabs (Hydrocodone-acetaminophen) .Marland Kitchen... Take 1 tablet by mouth four times a day as needed pain    Adult Aspirin Ec Low Strength 81 Mg Tbec (Aspirin) .Marland Kitchen... Take 1 by mouth once daily  Orders: Radiology Referral (Radiology)  Complete Medication  List: 1)  Glipizide 5 Mg Tabs (Glipizide) .... Take one by mouth daily 2)  Simvastatin 40 Mg Tabs (Simvastatin) .... Take one by mouth daily 3)  Travatan 0.004 % Soln (Travoprost) 4)  Hydrocodone-acetaminophen 5-325 Mg Tabs (Hydrocodone-acetaminophen) .... Take 1 tablet by mouth four times a day as needed pain 5)  Plavix 75 Mg Tabs (Clopidogrel bisulfate) .... Take 1 tablet by mouth once daily 6)  Adult Aspirin Ec Low Strength 81 Mg Tbec (Aspirin) .... Take 1 by mouth once daily 7)  Amlodipine Besy-benazepril Hcl 5-20 Mg Caps (Amlodipine besy-benazepril hcl) .... Take one by mouth daily 8)  Fish Oil 1000 Mg Caps (Omega-3 fatty acids) .... Take 1-2 by mouth daily 9)  Metoprolol Tartrate 100 Mg Tabs (Metoprolol tartrate) .... Take 1 by mouth two times a day 10)  Methazolamide 25 Mg Tabs (Methazolamide) .... Take 1 by mouth once daily 11)  Dorzolamide Hcl 2 % Soln (Dorzolamide hcl) .... Use in left eye before breakfast and dinner. 12)  Combigan 0.2-0.5 % Soln (Brimonidine tartrate-timolol) .Marland Kitchen.. 1 drop in each eye before breakfast and dinner 13)  Cipro 500 Mg Tabs (Ciprofloxacin hcl) .Marland Kitchen.. 1 by mouth 2 times daily x 10 days 14)  Flagyl 500 Mg Tabs (Metronidazole) .Marland Kitchen.. 1 tab by mouth three times a day x 10 days  Patient Instructions: 1)  Please stop by to see Aram Beecham on your way out to set up your CT scan. Prescriptions: HYDROCODONE-ACETAMINOPHEN 5-325 MG TABS (HYDROCODONE-ACETAMINOPHEN) take 1 tablet by mouth four times a day as needed pain  #60 x 0   Entered and Authorized by:   Ruthe Mannan MD   Signed by:   Ruthe Mannan MD on 10/28/2009   Method used:   Print then Give to Patient   RxID:   [id number] FLAGYL 500 MG TABS (METRONIDAZOLE) 1 tab by mouth three times a day x 10 days  #30 x 0   Entered and Authorized by:   Ruthe Mannan MD   Signed by:   Ruthe Mannan MD on 10/28/2009   Method used:   [facility]ron[facility] to        [facility] [phone]* (retail)       [address] [place], [place]  [address]       Ph: [phone] or [phone]       Fax: [phone]   RxID:   [id number] CIPRO 500 MG TABS (CIPROFLOXACIN HCL) 1 by mouth 2 times daily x 10 days  #20 x 0   Entered and Authorized by:   Ruthe Mannan MD   Signed by:   Ruthe Mannan MD on 10/28/2009   Method used:   Electronically to        [facility] [phone]* (retail)       [address] [place], [place]  [address]       Ph: [phone] or [phone]  Fax: [phone]   RxID:   [id number]   Current Allergies (reviewed today): No known allergies

## 2010-06-28 NOTE — Assessment & Plan Note (Signed)
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  Past Medical History: Reviewed history from 07/06/2008 and no changes required. Colonic polyps, hx of--tubular adenomas Diabetes mellitus, type II Diverticulosis, colon GERD Hypertension Glaucoma Degenerative disc disease Hyperlipidemia Osteoarthritis Coronary artery disease  Past Surgical History: Vaginal hysterectomy 1964 Breast cyst 2001 Glaucoma surgery  01/03-03/03 3 Level lumbar decompressive laminotomies with  foraminotomies 02/06 2/10 Angioplasty/stent-RCA---------Dr Arida Right cataract & glaucoma surgery---Dr Inez Pilgrim  4/10  Family History: Reviewed history from 10/09/2006 and no changes required. Dad died of lung cancer Brother died of kidney cancer Very strong FH of colon cancer (at least 2 of children)  Social History: Reviewed history from 10/09/2006 and no changes required. 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 No frequency during day Frustrated with eyes--not overly depressed  Physical Exam  General:  alert.  NAD Neck:  supple, no masses, no thyromegaly, no carotid bruits, and no cervical lymphadenopathy.   Lungs:  normal respiratory effort and normal breath sounds.   Heart:  normal rate, regular rhythm, no murmur, and no gallop.   Pulses:  1+ in feet Extremities:  no edema Skin:  no suspicious lesions and no ulcerations.   Psych:  normally interactive, good eye contact, not anxious appearing, and not depressed appearing.    Diabetes Management Exam:    Foot Exam (with socks and/or shoes not present):       Sensory-Pinprick/Light touch:          Left medial foot (L-4): normal          Left dorsal foot (L-5): normal          Left lateral foot (S-1): normal          Right medial foot (L-4): normal          Right dorsal foot (L-5): normal          Right lateral foot (S-1): normal       Inspection:          Left foot: normal          Right foot: normal       Nails:          Left foot:  normal          Right foot: normal    Eye Exam:       Eye Exam done elsewhere          Date: 06/17/2009          Results: serious non diabetic eye disease          Done by: Dr Inez Pilgrim   Impression & Recommendations:  Problem # 1:  DIABETES MELLITUS, TYPE II (ICD-250.00) Assessment Unchanged seems to be okay will check labs  Her updated medication list for this problem includes:    Glipizide 5 Mg Tabs (Glipizide) .Marland Kitchen... Take one by mouth daily    Adult Aspirin Ec Low Strength 81 Mg Tbec (Aspirin) .Marland Kitchen... Take 1 by mouth once daily    Amlodipine Besy-benazepril Hcl 5-20 Mg Caps (Amlodipine besy-benazepril hcl) .Marland Kitchen... Take one by mouth daily  Orders: TLB-A1C / Hgb A1C (Glycohemoglobin) (83036-A1C) TLB-Hepatic/Liver Function Pnl  (80076-HEPATIC)  Labs Reviewed: Creat: 0.7 (12/24/2008)     Last Eye Exam: serious non diabetic eye disease (06/17/2009) Reviewed HgBA1c results: 6.3 (12/24/2008)  6.5 (06/12/2008)  Problem # 2:  CORONARY ARTERY DISEASE (ICD-414.00) Assessment: Unchanged quiet  no changes needed  Her updated medication list for this problem includes:    Plavix 75 Mg Tabs (Clopidogrel bisulfate) .Marland Kitchen... Take 1 tablet by mouth once daily    Adult Aspirin Ec Low Strength 81 Mg Tbec (Aspirin) .Marland Kitchen... Take 1 by mouth once daily    Amlodipine Besy-benazepril Hcl 5-20 Mg Caps (Amlodipine besy-benazepril hcl) .Marland Kitchen... Take one by mouth daily    Metoprolol Tartrate 100 Mg Tabs (Metoprolol tartrate) .Marland Kitchen... Take 1 by mouth two times a day    Methazolamide 25 Mg Tabs (Methazolamide) .Marland Kitchen... Take 1 by mouth once daily  Problem # 3:  HYPERLIPIDEMIA (ICD-272.4) Assessment: Unchanged  due for labs  Her updated medication list for this problem includes:    Simvastatin 40 Mg Tabs (Simvastatin) .Marland Kitchen... Take one by mouth daily  Labs Reviewed: SGOT: 20 (12/24/2008)   SGPT: 15 (12/24/2008)   HDL:35.00 (12/24/2008), 29.5 (06/12/2008)  LDL:52 (12/24/2008), 37 (06/12/2008)  Chol:114 (12/24/2008), 96 (06/12/2008)  Trig:136.0 (12/24/2008), 146 (06/12/2008)  Orders: TLB-Lipid Panel (80061-LIPID)  Problem # 4:  OSTEOARTHRITIS (ICD-715.90) Assessment: Unchanged ongoing pain but okay with meds  Her updated medication list for this problem includes:    Hydrocodone-acetaminophen 5-325 Mg Tabs (Hydrocodone-acetaminophen) .Marland Kitchen... Take 1 tablet by mouth four times a day as needed pain    Adult Aspirin Ec Low Strength 81 Mg Tbec (Aspirin) .Marland Kitchen... Take 1 by mouth once daily  Complete Medication List: 1)  Glipizide 5 Mg Tabs (Glipizide) .... Take one by mouth daily 2)  Simvastatin 40 Mg Tabs (Simvastatin) .... Take one by mouth daily 3)  Travatan 0.004 % Soln (Travoprost) 4)  Hydrocodone-acetaminophen 5-325 Mg Tabs  (Hydrocodone-acetaminophen) .... Take 1 tablet by mouth four times a day as needed pain 5)  Plavix 75 Mg Tabs (Clopidogrel bisulfate) .... Take 1 tablet by mouth once daily 6)  Adult Aspirin Ec Low Strength 81 Mg Tbec (Aspirin) .... Take 1 by mouth once daily 7)  Amlodipine Besy-benazepril Hcl 5-20 Mg Caps (Amlodipine besy-benazepril hcl) .... Take one by mouth daily 8)  Fish Oil 1000 Mg Caps (Omega-3 fatty acids) .... Take 1-2 by mouth daily 9)  Metoprolol Tartrate 100 Mg Tabs (Metoprolol tartrate) .... Take 1 by mouth two times a day 10)  Methazolamide 25 Mg Tabs (Methazolamide) .... Take 1 by mouth once daily 11)  Dorzolamide Hcl 2 % Soln (Dorzolamide hcl) .... Use in left eye before breakfast and dinner. 12)  Combigan 0.2-0.5 % Soln (Brimonidine tartrate-timolol) .Marland Kitchen.. 1 drop in each eye before breakfast and dinner  Other Orders: TLB-Renal Function Panel (80069-RENAL) TLB-CBC Platelet - w/Differential (85025-CBCD) TLB-TSH (Thyroid Stimulating Hormone) (84443-TSH) Venipuncture ([address])  Patient Instructions: 1)  Please schedule a follow-up appointment in 6 months .   Current Allergies (reviewed today): No known allergies

## 2010-06-28 NOTE — Letter (Signed)
Summary: [facility]   Imported By: Lester [place]town 12/09/2009 12:43:31  _____________________________________________________________________  External Attachment:    Type:   Image     Comment:   External Document

## 2010-06-28 NOTE — Progress Notes (Signed)
Summary: Rx Hydrocodone  Phone Note Refill Request Call back at [phone] Message from:  [facility] on Oct 07, 2009 8:15 AM  Refills Requested: Medication #1:  HYDROCODONE-ACETAMINOPHEN 5-325 MG TABS take 1 tablet by mouth four times a day as needed pain   Last Refilled: 09/22/2009 Received faxed refill request please advise.  Form in your IN box.   Method Requested: Fax to Local Pharmacy Initial call taken by: Linde Gillis CMA Duncan Dull),  Oct 07, 2009 8:16 AM  Follow-up for Phone Call        please call got 2 months supply only 6 weeks ago What is going on? Shenoa Hattabaugh Dia Crawford MD  Oct 07, 2009 1:10 PM   left message with family member to have pt to return my call, pt was sleep. DeShannon Smith CMA Duncan Dull)  Oct 08, 2009 9:24 AM   pt's daughter called back and stated that pt uses hydrocodone 4 times daily and that she had eye surgery so she's in more pain. DeShannon Smith CMA Duncan Dull)  Oct 08, 2009 9:44 AM   She has not been using it that much until recently.  60 has held her for a month If she needs more than 2 a day on a ongoing basis, I will need to see her to review. Okay #60 x 0 Follow-up by: Cindee Salt MD,  Oct 08, 2009 10:52 AM  Additional Follow-up for Phone Call Additional follow up Details #1::        spoke with Daughter and advised results, rx faxed to pharmacy. Additional Follow-up by: Mervin Hack CMA Duncan Dull),  Oct 08, 2009 11:11 AM

## 2010-06-28 NOTE — Progress Notes (Signed)
Summary: CT order  Phone Note From Other Clinic   Caller: [facility]    Call For: Dr. Dayton Martes Summary of Call: Receptionist says that patient is scheduled for CT of the abdomen and pelvis with contrast and they do not have an order.  Perhaps Aram Beecham has not had a chance to do what she needs to do yet.  I'll send this to her as well. Initial call taken by: Delilah Shan CMA (AAMA),  October 29, 2009 1:45 PM  Follow-up for Phone Call        Order was placed so I am not sure. Ruthe Mannan MD  October 29, 2009 1:57 PM   Additional Follow-up for Phone Call Additional follow up Details #1::        A order was faxed  however, I will fax another one.Daine Gip  November 01, 2009 10:47 AM  Additional Follow-up by: Daine Gip,  November 01, 2009 10:47 AM

## 2010-06-28 NOTE — Miscellaneous (Signed)
Summary: Orders Update   Clinical Lists Changes  Orders: Added new Service order of Venipuncture (36415) - Signed Added new Test order of TLB-BMP (Basic Metabolic Panel-BMET) (80048-METABOL) - Signed 

## 2010-06-28 NOTE — Progress Notes (Signed)
Summary: Rx Hydrocodone  Phone Note Refill Request Call back at [phone] Message from:  [facility] on June 24, 2009 1:30 PM  Refills Requested: Medication #1:  HYDROCODONE-ACETAMINOPHEN 5-325 MG TABS take 1 tablet by mouth four times a day as needed pain   Last Refilled: 05/05/2009 Received faxed refill request, please advise   Method Requested: Telephone to Pharmacy Initial call taken by: Linde Gillis CMA Duncan Dull),  June 24, 2009 1:31 PM  Follow-up for Phone Call        Okay #60 x 1 Follow-up by: Cindee Salt MD,  June 24, 2009 2:07 PM  Additional Follow-up for Phone Call Additional follow up Details #1::        Rx called to pharmacy Additional Follow-up by: Mervin Hack CMA Duncan Dull),  June 24, 2009 2:48 PM    Prescriptions: HYDROCODONE-ACETAMINOPHEN 5-325 MG TABS (HYDROCODONE-ACETAMINOPHEN) take 1 tablet by mouth four times a day as needed pain  #60 x 1   Entered by:   Mervin Hack CMA (AAMA)   Authorized by:   Cindee Salt MD   Signed by:   Mervin Hack CMA (AAMA) on 06/24/2009   Method used:   Telephoned to ...       [facility] [phone]* (retail)       [address] [place], [place]  [address]       Ph: [phone] or [phone]       Fax: [phone]   RxID:   [id number]

## 2010-06-30 NOTE — Progress Notes (Signed)
Summary: VICODIN  Phone Note Refill Request Message from:  Fax from Pharmacy on May 24, 2010 10:08 AM  Refills Requested: Medication #1:  HYDROCODONE-ACETAMINOPHEN 5-325 MG TABS take 1 tablet by mouth four times a day as needed pain   Supply Requested: [facility] month [facility] [phone]   Method Requested: Telephone to Pharmacy Initial call taken by: Benny Lennert CMA Duncan Dull),  May 24, 2010 10:09 AM  Follow-up for Phone Call        okay #120 x 0 Follow-up by: Cindee Salt MD,  May 24, 2010 2:00 PM  Additional Follow-up for Phone Call Additional follow up Details #1::        Rx called to pharmacy Additional Follow-up by: Linde Gillis CMA Duncan Dull),  May 24, 2010 3:09 PM    Prescriptions: HYDROCODONE-ACETAMINOPHEN 5-325 MG TABS (HYDROCODONE-ACETAMINOPHEN) take 1 tablet by mouth four times a day as needed pain  #120 x 0   Entered by:   Linde Gillis CMA (AAMA)   Authorized by:   Cindee Salt MD   Signed by:   Linde Gillis CMA (AAMA) on 05/24/2010   Method used:   Telephoned to ...       [facility] [phone]* (retail)       [address] [place], [place]  [address]       Ph: [phone] or [phone]       Fax: [phone]   RxID:   [id number]

## 2010-06-30 NOTE — Assessment & Plan Note (Signed)
Summary: ROA 6 MTHS CYD  R/S FROM 06/03/10   Vital Signs:  Patient profile:   77 year old female Weight:      195 pounds Temp:     98.0 degrees F oral Pulse rate:   72 / minute Pulse rhythm:   regular BP sitting:   153 / 76  (left arm) Cuff size:   large  Vitals Entered By: Mervin Hack CMA Duncan Dull) (June 10, 2010 12:34 PM) CC: 6 month follow-up   History of Present Illness: "I'm okay"---not very enthusiastic Having some stomach trouble Feels "like my butt has shrunk"---feels it when her stools pass. Stools come out in "little gushes" Taking senna but does better with dulcolax  stays cold a lot relates to her blood thinner  Heart is okay No chest pain No SOB No edema  Hasn't been checking sugars No hypoglycemic spells  Allergies: No Known Drug Allergies  Past History:  Past medical, surgical, family and social histories (including risk factors) reviewed for relevance to current acute and chronic problems.  Past Medical History: Reviewed history from 07/06/2008 and no changes required. Colonic polyps, hx of--tubular adenomas Diabetes mellitus, type II Diverticulosis, colon GERD Hypertension Glaucoma Degenerative disc disease Hyperlipidemia Osteoarthritis Coronary artery disease  Past Surgical History: Reviewed history from 06/28/2009 and no changes required. Vaginal hysterectomy 1964 Breast cyst 2001 Glaucoma surgery  01/03-03/03 3 Level lumbar decompressive laminotomies with  foraminotomies 02/06 2/10 Angioplasty/stent-RCA---------Dr Arida Right cataract & glaucoma surgery---Dr Inez Pilgrim  4/10  Family History: Reviewed history from 10/09/2006 and no changes required. Dad died of lung cancer Brother died of kidney cancer Very strong FH of colon cancer (at least 2 of children)  Social History: Reviewed history from 10/09/2006 and no changes required. 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   Neck:  supple, no masses, no thyromegaly, no carotid bruits, and no cervical lymphadenopathy.   Lungs:  normal respiratory effort, no intercostal retractions, no accessory muscle use, and normal breath sounds.   Heart:  normal rate, regular rhythm, no murmur, and no gallop.   Abdomen:  soft and non-tender.   Pulses:  1+ in feet Extremities:  no sig edema Skin:  no suspicious lesions and no ulcerations.   Psych:  normally interactive, good eye contact, not anxious appearing, and not depressed appearing.    Diabetes Management Exam:    Foot Exam (with socks and/or shoes not present):       Sensory-Pinprick/Light touch:          Left medial foot (L-4): normal          Left dorsal foot (L-5): normal          Left lateral foot (S-1): normal          Right medial foot (L-4): normal          Right dorsal foot (L-5): normal          Right lateral foot (S-1): normal       Inspection:          Left foot: normal          Right foot: normal       Nails:          Left foot: thickened  Right foot: thickened   Impression & Recommendations:  Problem # 1:  CONSTIPATION, CHRONIC (ICD-564.09) Assessment Unchanged probably explains her pain and early satiety will have her add miralax to vegetable laxative  Her updated medication list for this problem includes:    Miralax Powd (Polyethylene glycol 3350) .Marland Kitchen... 1 capful daily with full glass of water    Senokot S 8.6-50 Mg Tabs (Sennosides-docusate sodium) .Marland Kitchen... 2 tabs daily or two times a day for constipation  Problem # 2:  DIABETES MELLITUS, TYPE II (ICD-250.00) Assessment: Unchanged  doesn't check  will do labs  Her updated medication list for this problem includes:    Glipizide 5 Mg Tabs (Glipizide) .Marland Kitchen... Take one by mouth daily    Amlodipine Besy-benazepril  Hcl 5-20 Mg Caps (Amlodipine besy-benazepril hcl) .Marland Kitchen... Take one by mouth daily    Adult Aspirin Ec Low Strength 81 Mg Tbec (Aspirin) .Marland Kitchen... Take 1 by mouth once daily  Labs Reviewed: Creat: 0.7 (10/29/2009)     Last Eye Exam: serious non diabetic eye disease (06/17/2009) Reviewed HgBA1c results: 6.2 (11/26/2009)  6.5 (06/28/2009)  Orders: TLB-A1C / Hgb A1C (Glycohemoglobin) (83036-A1C)  Problem # 3:  HYPERTENSION (ICD-401.9) Assessment: Unchanged  BP is up some will hold off on changes for now  Her updated medication list for this problem includes:    Amlodipine Besy-benazepril Hcl 5-20 Mg Caps (Amlodipine besy-benazepril hcl) .Marland Kitchen... Take one by mouth daily    Metoprolol Tartrate 100 Mg Tabs (Metoprolol tartrate) .Marland Kitchen... Take 1 by mouth two times a day  BP today: 153/76 Prior BP: 142/70 (11/26/2009)  Labs Reviewed: K+: 3.8 (06/28/2009) Creat: : 0.7 (10/29/2009)   Chol: 99 (06/28/2009)   HDL: 36.70 (06/28/2009)   LDL: 34 (06/28/2009)   TG: 140.0 (06/28/2009)  Orders: TLB-Renal Function Panel (80069-RENAL) TLB-CBC Platelet - w/Differential (85025-CBCD) TLB-TSH (Thyroid Stimulating Hormone) (84443-TSH) Venipuncture (98119)  Problem # 4:  HYPERLIPIDEMIA (ICD-272.4) Assessment: Comment Only  off statin in case it was related to stomach trouble if that improves, consider restarting  Labs Reviewed: SGOT: 17 (06/28/2009)   SGPT: 12 (06/28/2009)   HDL:36.70 (06/28/2009), 35.00 (12/24/2008)  LDL:34 (06/28/2009), 52 (12/24/2008)  Chol:99 (06/28/2009), 114 (12/24/2008)  Trig:140.0 (06/28/2009), 136.0 (12/24/2008)  Orders: TLB-Lipid Panel (80061-LIPID) TLB-Hepatic/Liver Function Pnl (80076-HEPATIC)  Problem # 5:  CORONARY ARTERY DISEASE (ICD-414.00) Assessment: Unchanged seems to be quiet  Her updated medication list for this problem includes:    Plavix 75 Mg Tabs (Clopidogrel bisulfate) .Marland Kitchen... Take 1 tablet by mouth once daily    Amlodipine Besy-benazepril Hcl 5-20 Mg Caps  (Amlodipine besy-benazepril hcl) .Marland Kitchen... Take one by mouth daily    Metoprolol Tartrate 100 Mg Tabs (Metoprolol tartrate) .Marland Kitchen... Take 1 by mouth two times a day    Adult Aspirin Ec Low Strength 81 Mg Tbec (Aspirin) .Marland Kitchen... Take 1 by mouth once daily  Problem # 6:  OSTEOARTHRITIS (ICD-715.90) Assessment: Unchanged needs two times a day meds usually  Her updated medication list for this problem includes:    Hydrocodone-acetaminophen 5-325 Mg Tabs (Hydrocodone-acetaminophen) .Marland Kitchen... Take 1 tablet by mouth four times a day as needed pain    Adult Aspirin Ec Low Strength 81 Mg Tbec (Aspirin) .Marland Kitchen... Take 1 by mouth once daily  Complete Medication List: 1)  Glipizide 5 Mg Tabs (Glipizide) .... Take one by mouth daily 2)  Hydrocodone-acetaminophen 5-325 Mg Tabs (Hydrocodone-acetaminophen) .... Take 1 tablet by mouth four times a day as needed pain 3)  Plavix 75 Mg Tabs (Clopidogrel bisulfate) .... Take 1 tablet by mouth once  daily 4)  Amlodipine Besy-benazepril Hcl 5-20 Mg Caps (Amlodipine besy-benazepril hcl) .... Take one by mouth daily 5)  Metoprolol Tartrate 100 Mg Tabs (Metoprolol tartrate) .... Take 1 by mouth two times a day 6)  Ranitidine Hcl 300 Mg Tabs (Ranitidine hcl) .Marland Kitchen.. 1 tab by mouth two times a day for stomach trouble 7)  Adult Aspirin Ec Low Strength 81 Mg Tbec (Aspirin) .... Take 1 by mouth once daily 8)  Combigan 0.2-0.5 % Soln (Brimonidine tartrate-timolol) .Marland Kitchen.. 1 drop in each eye before breakfast and dinner 9)  Travatan 0.004 % Soln (Travoprost) .Marland Kitchen.. 1 drop in each eye two times a day 10)  Dorzolamide Hcl 2 % Soln (Dorzolamide hcl) .Marland Kitchen.. 1 drop in each eye two times a day 11)  Miralax Powd (Polyethylene glycol 3350) .Marland Kitchen.. 1 capful daily with full glass of water 12)  Senokot S 8.6-50 Mg Tabs (Sennosides-docusate sodium) .... 2 tabs daily or two times a day for constipation  Patient Instructions: 1)  Please start miralax daily to open up your bowels. Continue senna-s 2)  Please try the  miralax  ~3 times daily for the next 2-3 days till really cleaned out 3)  Please schedule a follow-up appointment in 6 months .    Orders Added: 1)  TLB-Lipid Panel [80061-LIPID] 2)  TLB-Hepatic/Liver Function Pnl [80076-HEPATIC] 3)  TLB-Renal Function Panel [80069-RENAL] 4)  TLB-CBC Platelet - w/Differential [85025-CBCD] 5)  TLB-TSH (Thyroid Stimulating Hormone) [84443-TSH] 6)  Venipuncture [36415] 7)  TLB-A1C / Hgb A1C (Glycohemoglobin) [83036-A1C]    Current Allergies (reviewed today): No known allergies

## 2010-07-01 NOTE — Miscellaneous (Signed)
Summary: Controlled Substance Agreement  Controlled Substance Agreement   Imported By: Lanelle Bal 11/04/2009 09:12:57  _____________________________________________________________________  External Attachment:    Type:   Image     Comment:   External Document

## 2010-07-06 NOTE — Letter (Signed)
Summary: [facility]  [facility]   Imported By: Kassie Mends 07/01/2010 10:50:55  _____________________________________________________________________  External Attachment:    Type:   Image     Comment:   External Document

## 2010-07-08 ENCOUNTER — Telehealth: Payer: Self-pay | Admitting: Internal Medicine

## 2010-07-12 ENCOUNTER — Encounter: Payer: Self-pay | Admitting: Internal Medicine

## 2010-07-14 NOTE — Progress Notes (Signed)
Summary: refill request for vicodin  Phone Note Refill Request Message from:  Fax from Pharmacy  Refills Requested: Medication #1:  HYDROCODONE-ACETAMINOPHEN 5-325 MG TABS take 1 tablet by mouth four times a day as needed pain   Last Refilled: 05/24/2010 [facility] [address] [facility] on your desk.  Initial call taken by: Lowella Petties CMA, AAMA,  July 08, 2010 8:10 AM  Follow-up for Phone Call        okay #120 x 0 Follow-up by: Cindee Salt MD,  July 08, 2010 1:42 PM  Additional Follow-up for Phone Call Additional follow up Details #1::        Rx faxed to pharmacy Additional Follow-up by: DeShannon Katrinka Blazing CMA Duncan Dull),  July 08, 2010 2:23 PM    Prescriptions: HYDROCODONE-ACETAMINOPHEN 5-325 MG TABS (HYDROCODONE-ACETAMINOPHEN) take 1 tablet by mouth four times a day as needed pain  #120 x 0   Entered by:   Mervin Hack CMA (AAMA)   Authorized by:   Cindee Salt MD   Signed by:   Mervin Hack CMA (AAMA) on 07/08/2010   Method used:   Handwritten   RxID:   [id number]

## 2010-07-18 ENCOUNTER — Encounter: Payer: Self-pay | Admitting: Internal Medicine

## 2010-07-22 ENCOUNTER — Encounter: Payer: Self-pay | Admitting: Internal Medicine

## 2010-07-26 NOTE — Letter (Signed)
Summary: [facility]  [facility]   Imported By: Kassie Mends 07/19/2010 09:30:55  _____________________________________________________________________  External Attachment:    Type:   Image     Comment:   External Document  Appended Document: [facility] left breast biopsy done

## 2010-08-04 NOTE — Letter (Signed)
Summary: Dr. Lemar Livings visit note  Dr. Lemar Livings visit note   Imported By: Kassie Mends 07/27/2010 08:59:28  _____________________________________________________________________  External Attachment:    Type:   Image     Comment:   External Document  Appended Document: Dr. Lemar Livings visit note DCIS on breast biopsy will arrange follow up to plan reexcision

## 2010-08-09 ENCOUNTER — Telehealth: Payer: Self-pay | Admitting: Internal Medicine

## 2010-08-09 NOTE — Letter (Signed)
Summary: [facility]  [facility]   Imported By: Kassie Mends 08/01/2010 09:53:06  _____________________________________________________________________  External Attachment:    Type:   Image     Comment:   External Document  Appended Document:  [facility] right [facility]--planning reexcision to negative margins then RT

## 2010-08-16 NOTE — Progress Notes (Signed)
Summary: refill request for vicodin  Phone Note Refill Request Message from:  Fax from Pharmacy  Refills Requested: Medication #1:  HYDROCODONE-ACETAMINOPHEN 5-325 MG TABS take 1 tablet by mouth four times a day as needed pain   Last Refilled: 07/08/2010 [facility] [address] [facility] on [facility] desk.  Initial call taken by: Lowella Petties CMA, AAMA,  August 09, 2010 8:19 AM  Follow-up for Phone Call        please update order 1 up to four times daily  #120 x 0 Follow-up by: Cindee Salt MD,  August 09, 2010 9:10 AM  Additional Follow-up for Phone Call Additional follow up Details #1::        Rx called to pharmacy, med updated Additional Follow-up by: DeShannon Katrinka Blazing CMA Duncan Dull),  August 09, 2010 11:10 AM    New/Updated Medications: HYDROCODONE-ACETAMINOPHEN 5-325 MG TABS (HYDROCODONE-ACETAMINOPHEN) Take 1 by mouth up to four times daily Prescriptions: HYDROCODONE-ACETAMINOPHEN 5-325 MG TABS (HYDROCODONE-ACETAMINOPHEN) Take 1 by mouth up to four times daily  #120 x 0   Entered by:   Mervin Hack CMA (AAMA)   Authorized by:   Cindee Salt MD   Signed by:   Mervin Hack CMA (AAMA) on 08/09/2010   Method used:   Telephoned to ...       [facility] [phone]* (retail)       [address] [place], [place]  [address]       Ph: [phone] or [phone]       Fax: [phone]   RxID:   [id number]

## 2010-08-24 ENCOUNTER — Ambulatory Visit: Payer: Self-pay | Admitting: General Surgery

## 2010-08-28 ENCOUNTER — Ambulatory Visit: Payer: Self-pay | Admitting: Internal Medicine

## 2010-08-28 DIAGNOSIS — C50919 Malignant neoplasm of unspecified site of unspecified female breast: Secondary | ICD-10-CM

## 2010-08-28 HISTORY — DX: Malignant neoplasm of unspecified site of unspecified female breast: C50.919

## 2010-08-28 HISTORY — PX: MASTECTOMY: SHX3

## 2010-08-30 ENCOUNTER — Ambulatory Visit: Payer: Self-pay | Admitting: General Surgery

## 2010-09-13 ENCOUNTER — Encounter: Payer: Self-pay | Admitting: Internal Medicine

## 2010-09-19 ENCOUNTER — Ambulatory Visit: Payer: Self-pay | Admitting: Internal Medicine

## 2010-09-20 LAB — PATHOLOGY REPORT

## 2010-09-22 ENCOUNTER — Other Ambulatory Visit: Payer: Self-pay | Admitting: *Deleted

## 2010-09-22 NOTE — Telephone Encounter (Signed)
Dr.Letvak patient, pt is due for refill, please advise if refill ok

## 2010-09-23 MED ORDER — HYDROCODONE-ACETAMINOPHEN 5-325 MG PO TABS: ORAL_TABLET | ORAL | Status: DC

## 2010-09-23 NOTE — Telephone Encounter (Signed)
rx called to pharmacy 

## 2010-09-27 ENCOUNTER — Ambulatory Visit: Payer: Self-pay | Admitting: Radiation Oncology

## 2010-09-27 ENCOUNTER — Ambulatory Visit: Payer: Self-pay | Admitting: Internal Medicine

## 2010-10-14 NOTE — Op Note (Signed)
Jessica Nash, Jessica Nash                 ACCOUNT NO.:  [id number]   MEDICAL RECORD NO.:  [id number]          PATIENT TYPE:  INP   LOCATION:  NA                           FACILITY:  [facility]   PHYSICIAN:  Henry A. Pool, M.D.    DATE OF BIRTH:  17-Nov-1932   DATE OF PROCEDURE:  07/05/2004  DATE OF DISCHARGE:                                 OPERATIVE REPORT   PREOPERATIVE DIAGNOSES:  Left L3-4, left L4-5, and left L5-S1 stenosis with  radiculopathy.   POSTOPERATIVE DIAGNOSES:  Left L3-4, left L4-5, and left L5-S1 stenosis with  radiculopathy.   PROCEDURES:  Left L3-4, L4-5, and L5-S1 decompressive laminotomies with  foraminotomies.   SURGEON:  Kathaleen Maser. Pool, M.D.   ASSISTANT:  Reinaldo Meeker, M.D.   ANESTHESIA:  General endotracheal anesthesia.   INDICATIONS:  [name] is a 76 year old female, with a history of severe  left lower extremity pain, consistent with a mixed left-sided lumbar  radiculopathy. Workup has demonstrated evidence of marked spondylosis with  stenosis at L3-4, L4-5, and L5-S1. The patient has no right-sided symptoms.  She has minimal back pain. She presents now three-level decompressive  laminotomy on the left side, in hopes of improving her symptoms.   DESCRIPTION OF PROCEDURE:  The patient taken to the operating room, placed  in supine position. After an adequate level anesthesia was achieved, the  patient was turned prone on the Wilson frame, appropriately padded,  __________. She was prepped and draped sterilely.   The 10 blade used to make a linear incision overlying the L3-4, 5, and S1  levels. This was carried down sharply in the midline. A subperiosteal  dissection was performed, exposing the lamina and facet joints of L3-4, 5,  and S1. Deep self-retaining retractor placed. Intraoperative x-rays taken  and levels were confirmed. Decompressive laminotomies were then performed at  L3-4, L4-5, and L5-S1 using the high-speed drill and Kerrison rongeurs to  debride the inferior aspects of the laminae above, the medial aspect of the  facet joint, and the superior aspect of the laminae below. Ligamentum flavum  was then elevated and resected _________ using Kerrison rongeurs. Underlying  thecal sac and exiting nerve roots were identified. Wide-decompressive  foraminotomies along the course of the exiting nerve roots were performed at  all three levels. There was no evidence of complications. There was no  evidence of injury to thecal sac or nerve roots.   The wound was then irrigated with antibiotic solution. Gelfoam was placed  topically and hemostasis was found to be good. The microscope and retractor  system were removed. Hemostasis was achieved with electrocautery. The wounds  were closed in layers with Vicryl sutures. Steri-Strips and sterile  dressings were applied.   There were no operative complications. The patient tolerated this well and  she was returned to recovery postoperatively.      HAP/MEDQ  D:  07/05/2004  T:  07/05/2004  Job:  [id number]

## 2010-10-28 ENCOUNTER — Ambulatory Visit: Payer: Self-pay | Admitting: Radiation Oncology

## 2010-10-28 ENCOUNTER — Ambulatory Visit: Payer: Self-pay | Admitting: Internal Medicine

## 2010-11-11 ENCOUNTER — Other Ambulatory Visit: Payer: Self-pay | Admitting: *Deleted

## 2010-11-11 MED ORDER — HYDROCODONE-ACETAMINOPHEN 5-325 MG PO TABS: ORAL_TABLET | ORAL | Status: DC

## 2010-11-11 NOTE — Telephone Encounter (Signed)
[facility] 

## 2010-12-01 ENCOUNTER — Encounter: Payer: Self-pay | Admitting: Internal Medicine

## 2010-12-02 ENCOUNTER — Other Ambulatory Visit: Payer: Self-pay | Admitting: Internal Medicine

## 2010-12-05 ENCOUNTER — Encounter: Payer: Self-pay | Admitting: Internal Medicine

## 2010-12-05 ENCOUNTER — Ambulatory Visit (INDEPENDENT_AMBULATORY_CARE_PROVIDER_SITE_OTHER): Payer: PRIVATE HEALTH INSURANCE | Admitting: Internal Medicine

## 2010-12-05 DIAGNOSIS — E119 Type 2 diabetes mellitus without complications: Secondary | ICD-10-CM

## 2010-12-05 DIAGNOSIS — C50919 Malignant neoplasm of unspecified site of unspecified female breast: Secondary | ICD-10-CM | POA: Insufficient documentation

## 2010-12-05 DIAGNOSIS — I2510 Atherosclerotic heart disease of native coronary artery without angina pectoris: Secondary | ICD-10-CM

## 2010-12-05 DIAGNOSIS — I10 Essential (primary) hypertension: Secondary | ICD-10-CM

## 2010-12-05 NOTE — Assessment & Plan Note (Signed)
Right mastectomy RT to other lesion on left Seems to be healing okay--though slowly

## 2010-12-05 NOTE — Assessment & Plan Note (Signed)
Recent arm symptoms provoked stress test by Dr Welton Flakes Awaiting results

## 2010-12-05 NOTE — Assessment & Plan Note (Signed)
BP Readings from Last 3 Encounters:  12/05/10 148/80  06/10/10 153/76  11/26/09 142/70   Good control in general No changes Lab Results  Component Value Date   CREATININE 0.7 06/10/2010

## 2010-12-05 NOTE — Progress Notes (Signed)
Subjective:    Patient ID: Jessica Nash, female    DOB: 1933/05/03, 77 y.o.   MRN: [id number]  HPI Here with daughter Had right breast cancer diagnosed----mastectomy and then RT Still healing over from this Rx and has drainage. Doesn't look good  Emotionally has been okay Did have positive lymph nodes No current chemo planned  Checking sugars only when in hospital Is blind--can't check herself No hypoglycemic reactions  No chest pain No SOB No apparent ankle swelling  Current Outpatient Prescriptions on File Prior to Visit  Medication Sig Dispense Refill  . amLODipine-benazepril (LOTREL) 5-20 MG per capsule Take 1 capsule by mouth daily.        Marland Kitchen aspirin 81 MG tablet Take 81 mg by mouth daily.        . brimonidine-timolol (COMBIGAN) 0.2-0.5 % ophthalmic solution Place 1 drop into both eyes every 12 (twelve) hours.        . clopidogrel (PLAVIX) 75 MG tablet Take 75 mg by mouth daily.        . dorzolamide (TRUSOPT) 2 % ophthalmic solution Place 1 drop into both eyes 2 (two) times daily.        Marland Kitchen glipiZIDE (GLUCOTROL) 5 MG tablet Take 5 mg by mouth daily.        Marland Kitchen HYDROcodone-acetaminophen (NORCO) 5-325 MG per tablet Take 1 by mouth up to four times daily  120 tablet  0  . metoprolol (LOPRESSOR) 100 MG tablet Take 100 mg by mouth 2 (two) times daily.        . polyethylene glycol powder (MIRALAX) powder Take 17 g by mouth daily.        . ranitidine (ZANTAC) 300 MG tablet 1 TAB BY MOUTH TWO TIMES A DAY FOR STOMACH TROUBLE  60 tablet  12  . sennosides-docusate sodium (SENOKOT-S) 8.6-50 MG tablet Take 1 tablet by mouth daily.        . travoprost, benzalkonium, (TRAVATAN) 0.004 % ophthalmic solution Place 1 drop into both eyes 2 (two) times daily.          No Known Allergies  Past Medical History  Diagnosis Date  . Breast cancer metastasized to axillary lymph node 4/12    Papillary carcinoma. 2/3 sentinel nodes positive. Dr Lemar Livings  . Hx of colonic polyps   . Diabetes mellitus   .  Diverticulitis   . GERD (gastroesophageal reflux disease)   . Hypertension   . Glaucoma   . Degenerative disc disease   . Hyperlipidemia   . CAD (coronary artery disease)   . Breast cyst   . Hx of decompressive lumbar laminectomy     3 Level lumbar decompressive laminotomies with  foraminotomies 02/06  . Breast cancer     Past Surgical History  Procedure Date  . Abdominal hysterectomy   . Glaucoma surgery 01/03-03/03  . Angioplasty     2/10 Angioplasty/stent-RCA---------Dr Arida  . Mastectomy 4/12    Dr Lauree Chandler RT after    No family history on file.  History   Social History  . Marital Status: Widowed    Spouse Name: N/A    Number of Children: 5  . Years of Education: N/A   Occupational History  . retired as Artist    Social History Main Topics  . Smoking status: Current Everyday Smoker  . Smokeless tobacco: Never Used  . Alcohol Use: No  . Drug Use: No  . Sexually Active: Not on file   Other Topics Concern  . Not on  file   Social History Narrative   Jessica Nash mom   Review of Systems Frequent nocturia and it interrupts sleep Naps in day Lives with daughter Jessica Nash with ADLs but not much with housework, etc     Objective:   Physical Exam  Constitutional: She appears well-developed and well-nourished.  Eyes:       blind  Neck: Normal range of motion. Neck supple. No thyromegaly present.  Cardiovascular: Normal rate, regular rhythm, normal heart sounds and intact distal pulses.  Exam reveals no gallop.   No murmur heard. Pulmonary/Chest: Effort normal and breath sounds normal. No respiratory distress. She has no wheezes. She has no rales.  Abdominal: Soft. There is no tenderness.  Genitourinary:       Left breast has open area on lateral aspect Thin nonviable tissue around outside but very superficial without infection---discussed cleaning with soap and water and keeping moist with neosporin    Musculoskeletal: Normal range of motion. She exhibits no edema and no tenderness.  Lymphadenopathy:    She has no cervical adenopathy.  Psychiatric: She has a normal mood and affect. Her behavior is normal. Judgment and thought content normal.          Assessment & Plan:

## 2010-12-05 NOTE — Assessment & Plan Note (Signed)
Seems to still have good control Doesn't check due to vision Feet fine  Lab Results  Component Value Date   HGBA1C 6.6* 06/10/2010   Will recheck

## 2010-12-19 ENCOUNTER — Telehealth: Payer: Self-pay | Admitting: *Deleted

## 2010-12-19 NOTE — Telephone Encounter (Signed)
OPened in error.

## 2010-12-23 ENCOUNTER — Telehealth: Payer: Self-pay | Admitting: *Deleted

## 2010-12-23 NOTE — Telephone Encounter (Signed)
Insurance rep called.  Pt would like to have an aid to help her around the house, which medicare usually doesn't pay for.  Insurance rep states that if you did a referral and sent documentation explaining pt's need to the medical director at [employer] [employer] that there could be a possibility that in home care could be approved.  Info needs to be faxed to [phone].  Attn Margot.  I advised Clydie Braun that you are out until 8/6, so this will not be done until after your return.  She understood.

## 2010-12-30 ENCOUNTER — Other Ambulatory Visit: Payer: Self-pay | Admitting: *Deleted

## 2010-12-30 MED ORDER — HYDROCODONE-ACETAMINOPHEN 5-325 MG PO TABS: ORAL_TABLET | ORAL | Status: DC

## 2010-12-30 NOTE — Telephone Encounter (Signed)
Please call in

## 2011-01-02 NOTE — Telephone Encounter (Signed)
I do forms for home care aides all the time Why can't they just send me the form She is legally blind and requires help with some of her ADLs, along with other issues

## 2011-01-04 NOTE — Telephone Encounter (Signed)
rx called into pharmacy

## 2011-01-09 NOTE — Telephone Encounter (Signed)
Faxed last office note, insurance card, snap shop to [employer] at [employer] co to request [facility] aide for the patient.

## 2011-01-16 ENCOUNTER — Other Ambulatory Visit: Payer: Self-pay | Admitting: Internal Medicine

## 2011-01-21 IMAGING — CR DG CHEST 1V PORT
1 series · 1 of 1 positions shown · non-contrast
Comparison: none

REASON FOR EXAM: Chest Pain
COMMENTS:

PROCEDURE:     DXR - DXR PORTABLE CHEST SINGLE VIEW  - January 15, 2009  [DATE]
RESULT:     Comparison is made to the prior exam of 07/04/2008. The lung
fields are clear. No pneumonia, pneumothorax or pleural effusion is seen.
Heart size is normal. Monitoring electrodes are present.

[view not recorded]
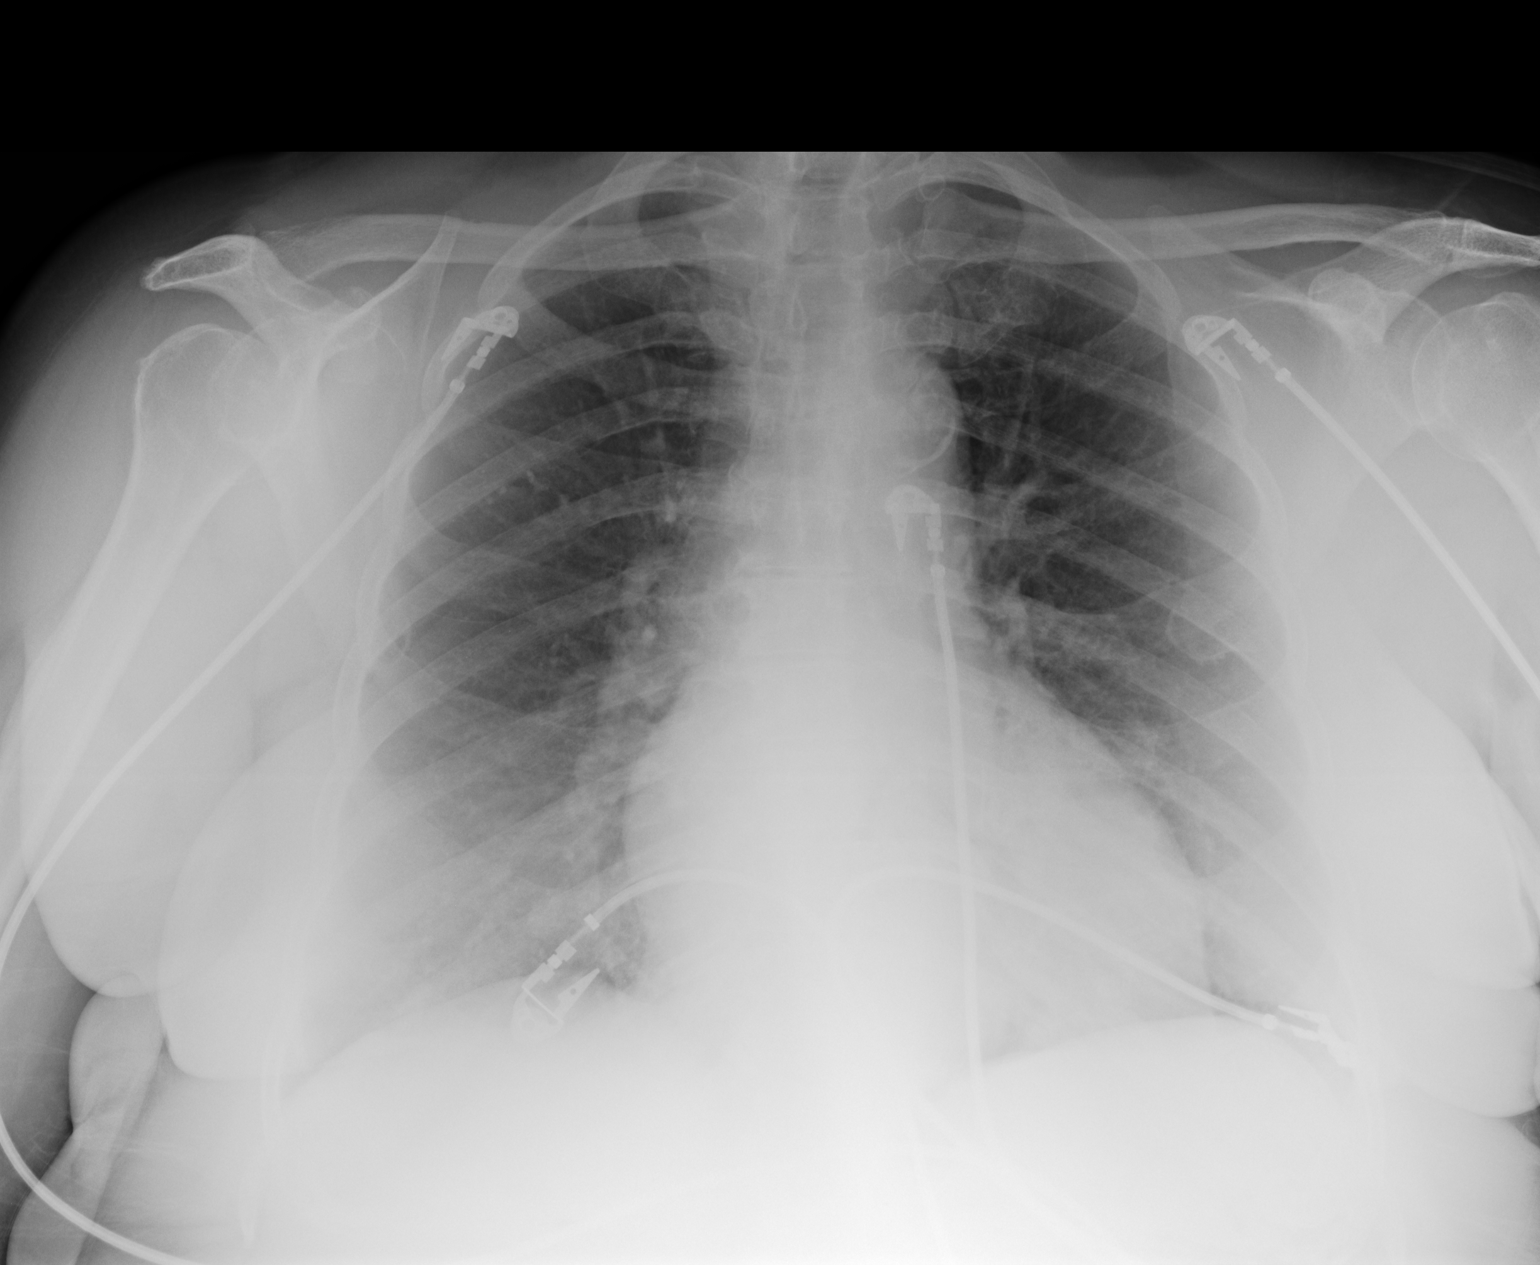

[1 of 1 positions shown; findings below may reference images not displayed]

IMPRESSION: 1.     No acute changes are identified.

## 2011-01-31 ENCOUNTER — Ambulatory Visit: Payer: Self-pay | Admitting: Internal Medicine

## 2011-02-21 ENCOUNTER — Other Ambulatory Visit: Payer: Self-pay | Admitting: *Deleted

## 2011-02-21 MED ORDER — HYDROCODONE-ACETAMINOPHEN 5-325 MG PO TABS: ORAL_TABLET | ORAL | Status: DC

## 2011-02-21 NOTE — Telephone Encounter (Signed)
Okay #120 x 0 

## 2011-02-21 NOTE — Telephone Encounter (Signed)
Form on your desk  

## 2011-02-21 NOTE — Telephone Encounter (Signed)
rx faxed to pharmacy manually  

## 2011-02-27 ENCOUNTER — Ambulatory Visit: Payer: Self-pay | Admitting: Internal Medicine

## 2011-04-13 ENCOUNTER — Other Ambulatory Visit: Payer: Self-pay | Admitting: *Deleted

## 2011-04-13 MED ORDER — HYDROCODONE-ACETAMINOPHEN 5-325 MG PO TABS: ORAL_TABLET | ORAL | Status: DC

## 2011-04-13 NOTE — Telephone Encounter (Signed)
rx called into pharmacy

## 2011-04-13 NOTE — Telephone Encounter (Signed)
Okay #120 x 0 

## 2011-05-11 ENCOUNTER — Ambulatory Visit: Payer: Self-pay | Admitting: Internal Medicine

## 2011-05-18 ENCOUNTER — Other Ambulatory Visit: Payer: Self-pay | Admitting: *Deleted

## 2011-05-18 MED ORDER — HYDROCODONE-ACETAMINOPHEN 5-325 MG PO TABS: 1.0000 | ORAL_TABLET | Freq: Four times a day (QID) | ORAL | Status: DC | PRN

## 2011-05-18 NOTE — Telephone Encounter (Signed)
Okay #120 x 0 

## 2011-05-18 NOTE — Telephone Encounter (Signed)
Last filled 04/13/11

## 2011-05-18 NOTE — Telephone Encounter (Signed)
rx called into pharmacy

## 2011-05-30 ENCOUNTER — Ambulatory Visit: Payer: Self-pay | Admitting: Internal Medicine

## 2011-05-30 DIAGNOSIS — L02219 Cutaneous abscess of trunk, unspecified: Secondary | ICD-10-CM

## 2011-05-30 HISTORY — DX: Cutaneous abscess of trunk, unspecified: L02.219

## 2011-06-02 LAB — CBC CANCER CENTER
Basophil #: 0 x10 3/mm (ref 0.0–0.1)
Eosinophil %: 2 %
HCT: 39.8 % (ref 35.0–47.0)
HGB: 13.4 g/dL (ref 12.0–16.0)
Lymphocyte #: 2.6 x10 3/mm (ref 1.0–3.6)
Lymphocyte %: 45 %
MCV: 89 fL (ref 80–100)
Monocyte #: 0.5 x10 3/mm (ref 0.0–0.7)
Monocyte %: 9.2 %
Neutrophil #: 2.4 x10 3/mm (ref 1.4–6.5)
Neutrophil %: 43 %
RBC: 4.46 10*6/uL (ref 3.80–5.20)
RDW: 15.3 % — ABNORMAL HIGH (ref 11.5–14.5)
WBC: 5.7 x10 3/mm (ref 3.6–11.0)

## 2011-06-02 LAB — CREATININE, SERUM
Creatinine: 0.66 mg/dL (ref 0.60–1.30)
EGFR (African American): >60
EGFR (Non-African Amer.): >60

## 2011-06-02 LAB — HEPATIC FUNCTION PANEL A (ARMC)
Albumin: 3.6 g/dL (ref 3.4–5.0)
Alkaline Phosphatase: 61 U/L (ref 50–136)
Bilirubin,Total: 0.2 mg/dL (ref 0.2–1.0)
SGOT(AST): 14 U/L — ABNORMAL LOW (ref 15–37)
SGPT (ALT): 26 U/L
Total Protein: 8.2 g/dL (ref 6.4–8.2)

## 2011-06-03 LAB — CANCER ANTIGEN 27.29: CA 27.29: 16.9 U/mL (ref 0.0–38.6)

## 2011-06-08 ENCOUNTER — Ambulatory Visit (INDEPENDENT_AMBULATORY_CARE_PROVIDER_SITE_OTHER): Payer: PRIVATE HEALTH INSURANCE | Admitting: Internal Medicine

## 2011-06-08 ENCOUNTER — Encounter: Payer: Self-pay | Admitting: Internal Medicine

## 2011-06-08 VITALS — BP 148/82 | HR 65 | Temp 97.5°F | Ht 65.0 in | Wt 205.0 lb

## 2011-06-08 DIAGNOSIS — E785 Hyperlipidemia, unspecified: Secondary | ICD-10-CM

## 2011-06-08 DIAGNOSIS — I10 Essential (primary) hypertension: Secondary | ICD-10-CM

## 2011-06-08 DIAGNOSIS — E119 Type 2 diabetes mellitus without complications: Secondary | ICD-10-CM

## 2011-06-08 DIAGNOSIS — M1990 Unspecified osteoarthritis, unspecified site: Secondary | ICD-10-CM

## 2011-06-08 DIAGNOSIS — K219 Gastro-esophageal reflux disease without esophagitis: Secondary | ICD-10-CM

## 2011-06-08 DIAGNOSIS — H543 Unqualified visual loss, both eyes: Secondary | ICD-10-CM | POA: Insufficient documentation

## 2011-06-08 MED ORDER — AMLODIPINE BESY-BENAZEPRIL HCL 10-20 MG PO CAPS: 1.0000 | ORAL_CAPSULE | Freq: Every day | ORAL | Status: DC

## 2011-06-08 MED ORDER — ATORVASTATIN CALCIUM 10 MG PO TABS: 10.0000 mg | ORAL_TABLET | Freq: Every day | ORAL | Status: DC

## 2011-06-08 NOTE — Assessment & Plan Note (Signed)
Lab Results  Component Value Date   HGBA1C 6.5 12/06/2010   Has had good control Will check again

## 2011-06-08 NOTE — Assessment & Plan Note (Signed)
Okay with ranitidine 

## 2011-06-08 NOTE — Assessment & Plan Note (Signed)
BP Readings from Last 3 Encounters:  06/08/11 148/82  12/05/10 148/80  06/10/10 153/76   Mildly elevated still Will increase amlodipine

## 2011-06-08 NOTE — Assessment & Plan Note (Signed)
Lab Results  Component Value Date   LDLCALC 34 06/28/2009   Not at goal for diabetic Will start atorvastatin

## 2011-06-08 NOTE — Patient Instructions (Addendum)
Please start the atorvastatin for cholesterol. Change to new dose of amlodipine--benazepril when the others are done----now will be 10/20 Set up blood work in about 6 weeks (lipid, hepatic---272.4)

## 2011-06-08 NOTE — Assessment & Plan Note (Signed)
Continues to use hydrocodone regularly

## 2011-06-08 NOTE — Progress Notes (Signed)
Subjective:    Patient ID: Jessica Nash, female    DOB: [birth year], 78 y.o.   MRN: [id number]  HPI Here with daughter  Still has great problems from her blindness Gets around okay in her house---trouble if she visits with anyone  Left breast was healing up---then pain after a follow up mammogram Has seen oncologist since then Still has some pain Seems to be done with Rx for now--other than the arimidex  Hasn't had sugars checked No hypoglycemic spells  No chest pain No SOB No edema No dizziness or fainting spells  Current Outpatient Prescriptions on File Prior to Visit  Medication Sig Dispense Refill  . amLODipine-benazepril (LOTREL) 5-20 MG per capsule Take 1 capsule by mouth daily.        Marland Kitchen anastrozole (ARIMIDEX) 1 MG tablet Take 1 mg by mouth daily.        Marland Kitchen aspirin 81 MG tablet Take 81 mg by mouth daily.        . brimonidine-timolol (COMBIGAN) 0.2-0.5 % ophthalmic solution Place 1 drop into both eyes every 12 (twelve) hours.        . Calcium Carbonate-Vitamin D (CALCIUM-VITAMIN D) 500-200 MG-UNIT per tablet Take 1 tablet by mouth 2 (two) times daily with a meal.        . clopidogrel (PLAVIX) 75 MG tablet Take 75 mg by mouth daily.        . dorzolamide (TRUSOPT) 2 % ophthalmic solution Place 1 drop into both eyes 2 (two) times daily.        Marland Kitchen glipiZIDE (GLUCOTROL) 5 MG tablet TAKE 1 TABLET BY MOUTH DAILY  90 tablet  2  . HYDROcodone-acetaminophen (NORCO) 5-325 MG per tablet Take 1 tablet by mouth 4 (four) times daily as needed for pain.  120 tablet  0  . metoprolol (LOPRESSOR) 100 MG tablet Take 100 mg by mouth 2 (two) times daily.        . polyethylene glycol powder (MIRALAX) powder Take 17 g by mouth daily.        . ranitidine (ZANTAC) 300 MG tablet 1 TAB BY MOUTH TWO TIMES A DAY FOR STOMACH TROUBLE  60 tablet  12  . sennosides-docusate sodium (SENOKOT-S) 8.6-50 MG tablet Take 1 tablet by mouth daily.        . travoprost, benzalkonium, (TRAVATAN) 0.004 % ophthalmic solution  Place 1 drop into both eyes 2 (two) times daily.          No Known Allergies  Past Medical History  Diagnosis Date  . Breast cancer metastasized to axillary lymph node 4/12    Papillary carcinoma. 2/3 sentinel nodes positive. Dr Lemar Livings  . Hx of colonic polyps   . Diabetes mellitus   . Diverticulitis   . GERD (gastroesophageal reflux disease)   . Hypertension   . Glaucoma   . Degenerative disc disease   . Hyperlipidemia   . CAD (coronary artery disease)   . Breast cyst   . Hx of decompressive lumbar laminectomy     3 Level lumbar decompressive laminotomies with  foraminotomies 02/06  . Breast cancer     Past Surgical History  Procedure Date  . Abdominal hysterectomy   . Glaucoma surgery 01/03-03/03  . Angioplasty     2/10 Angioplasty/stent-RCA---------Dr Arida  . Mastectomy 4/12    Dr Lauree Chandler RT after    No family history on file.  History   Social History  . Marital Status: Widowed    Spouse Name: N/A    Number of  Children: 5  . Years of Education: N/A   Occupational History  . retired as Artist    Social History Main Topics  . Smoking status: Current Everyday Smoker  . Smokeless tobacco: Never Used  . Alcohol Use: No  . Drug Use: No  . Sexually Active: Not on file   Other Topics Concern  . Not on file   Social History Narrative   Lyn Hollingshead mom   Review of Systems No stomach troubles  Constipation at times Uses the hydrocodone mostly for breast pain    Objective:   Physical Exam  Constitutional: She appears well-developed and well-nourished. No distress.  Eyes:       blind  Neck: Normal range of motion.  Cardiovascular: Normal rate, regular rhythm, normal heart sounds and intact distal pulses.  Exam reveals no gallop.   No murmur heard. Pulmonary/Chest: Effort normal and breath sounds normal. No respiratory distress. She has no wheezes. She has no rales.  Abdominal: Soft. There is no tenderness.    Genitourinary:       Left breast has induration surrounding black eschar which is stable ~3 o'clock (radiation damage?)  Musculoskeletal: She exhibits no edema and no tenderness.  Lymphadenopathy:    She has no cervical adenopathy.  Skin:       No foot lesions  Psychiatric: She has a normal mood and affect. Her behavior is normal.          Assessment & Plan:

## 2011-06-09 LAB — CBC WITH DIFFERENTIAL/PLATELET
Basophils Relative: 0.6 % (ref 0.0–3.0)
Eosinophils Absolute: 0.2 10*3/uL (ref 0.0–0.7)
Eosinophils Relative: 2.7 % (ref 0.0–5.0)
Hemoglobin: 13.7 g/dL (ref 12.0–15.0)
MCHC: 33.2 g/dL (ref 30.0–36.0)
MCV: 91.2 fl (ref 78.0–100.0)
Monocytes Absolute: 0.5 10*3/uL (ref 0.1–1.0)
Neutro Abs: 2.4 10*3/uL (ref 1.4–7.7)
RBC: 4.54 Mil/uL (ref 3.87–5.11)
WBC: 6.1 10*3/uL (ref 4.5–10.5)

## 2011-06-09 LAB — BASIC METABOLIC PANEL
BUN: 12 mg/dL (ref 6–23)
Chloride: 103 mEq/L (ref 96–112)
Creatinine, Ser: 0.7 mg/dL (ref 0.4–1.2)
GFR: 99.56 mL/min (ref >60.00)
Glucose, Bld: 107 mg/dL — ABNORMAL HIGH (ref 70–99)
Potassium: 4.6 mEq/L (ref 3.5–5.1)

## 2011-06-09 LAB — MICROALBUMIN / CREATININE URINE RATIO
Creatinine,U: 100.6 mg/dL
Microalb, Ur: 6.6 mg/dL — ABNORMAL HIGH (ref 0.0–1.9)

## 2011-06-13 ENCOUNTER — Telehealth: Payer: Self-pay | Admitting: *Deleted

## 2011-06-13 NOTE — Telephone Encounter (Signed)
Called to speak with daughter about lab results. Per daughter,  pt's BP meds Amlodipine- Benazepril 10-20 was already increased by the cardiologist maybe last year she thought? She went to pick up the medication from the pharmacy it was still the same as before. I Truitt Leep didn't get notes from cardiology so he didn't know that.

## 2011-06-13 NOTE — Telephone Encounter (Signed)
Her blood pressure is really not that high I was increasing the dose, I thought, of a med she was already taking I don't really want to add another med now Will discuss at her next visit

## 2011-06-14 NOTE — Telephone Encounter (Signed)
Spoke with daughter and advised results.  

## 2011-06-20 ENCOUNTER — Other Ambulatory Visit: Payer: Self-pay | Admitting: Internal Medicine

## 2011-06-30 ENCOUNTER — Ambulatory Visit: Payer: Self-pay | Admitting: Internal Medicine

## 2011-06-30 ENCOUNTER — Other Ambulatory Visit: Payer: Self-pay | Admitting: *Deleted

## 2011-06-30 MED ORDER — HYDROCODONE-ACETAMINOPHEN 5-325 MG PO TABS: 1.0000 | ORAL_TABLET | Freq: Four times a day (QID) | ORAL | Status: DC | PRN

## 2011-06-30 NOTE — Telephone Encounter (Signed)
Okay #120 x 0 

## 2011-06-30 NOTE — Telephone Encounter (Signed)
rx called into pharmacy

## 2011-07-12 ENCOUNTER — Other Ambulatory Visit: Payer: Self-pay | Admitting: Internal Medicine

## 2011-07-12 DIAGNOSIS — E785 Hyperlipidemia, unspecified: Secondary | ICD-10-CM

## 2011-07-20 ENCOUNTER — Other Ambulatory Visit (INDEPENDENT_AMBULATORY_CARE_PROVIDER_SITE_OTHER): Payer: PRIVATE HEALTH INSURANCE

## 2011-07-20 DIAGNOSIS — E785 Hyperlipidemia, unspecified: Secondary | ICD-10-CM

## 2011-07-20 LAB — HEPATIC FUNCTION PANEL
AST: 14 U/L (ref 0–37)
Albumin: 3.9 g/dL (ref 3.5–5.2)
Alkaline Phosphatase: 35 U/L — ABNORMAL LOW (ref 39–117)
Total Bilirubin: 0.6 mg/dL (ref 0.3–1.2)

## 2011-07-20 LAB — LIPID PANEL
Total CHOL/HDL Ratio: 3
Triglycerides: 252 mg/dL — ABNORMAL HIGH (ref 0.0–149.0)
VLDL: 50.4 mg/dL — ABNORMAL HIGH (ref 0.0–40.0)

## 2011-07-20 LAB — LDL CHOLESTEROL, DIRECT: Direct LDL: 53.3 mg/dL

## 2011-07-28 ENCOUNTER — Other Ambulatory Visit: Payer: Self-pay | Admitting: *Deleted

## 2011-07-28 NOTE — Telephone Encounter (Signed)
Last refill 06/30/2011. 

## 2011-07-29 NOTE — Telephone Encounter (Signed)
Okay #120 x 0 

## 2011-07-31 MED ORDER — HYDROCODONE-ACETAMINOPHEN 5-325 MG PO TABS: 1.0000 | ORAL_TABLET | Freq: Four times a day (QID) | ORAL | Status: DC | PRN

## 2011-07-31 NOTE — Telephone Encounter (Signed)
rx called into pharmacy

## 2011-08-29 ENCOUNTER — Other Ambulatory Visit: Payer: Self-pay | Admitting: *Deleted

## 2011-08-29 MED ORDER — HYDROCODONE-ACETAMINOPHEN 5-325 MG PO TABS: 1.0000 | ORAL_TABLET | Freq: Four times a day (QID) | ORAL | Status: DC | PRN

## 2011-08-29 NOTE — Telephone Encounter (Signed)
Okay #120 x 0 

## 2011-08-29 NOTE — Telephone Encounter (Signed)
Last filled 07-31-2011 last office visit 06-08-2011

## 2011-08-29 NOTE — Telephone Encounter (Signed)
rx called into pharmacy

## 2011-09-19 ENCOUNTER — Ambulatory Visit: Payer: Self-pay | Admitting: Anesthesiology

## 2011-09-19 LAB — BASIC METABOLIC PANEL
BUN: 15 mg/dL (ref 7–18)
Co2: 34 mmol/L — ABNORMAL HIGH (ref 21–32)
Creatinine: 0.87 mg/dL (ref 0.60–1.30)
EGFR (Non-African Amer.): >60
Glucose: 87 mg/dL (ref 65–99)
Osmolality: 285 (ref 275–301)
Sodium: 143 mmol/L (ref 136–145)

## 2011-09-20 ENCOUNTER — Ambulatory Visit: Payer: Self-pay | Admitting: General Surgery

## 2011-09-27 ENCOUNTER — Encounter: Payer: Self-pay | Admitting: Internal Medicine

## 2011-09-27 DIAGNOSIS — Z0279 Encounter for issue of other medical certificate: Secondary | ICD-10-CM

## 2011-10-09 ENCOUNTER — Other Ambulatory Visit: Payer: Self-pay | Admitting: *Deleted

## 2011-10-09 MED ORDER — HYDROCODONE-ACETAMINOPHEN 5-325 MG PO TABS: 1.0000 | ORAL_TABLET | Freq: Four times a day (QID) | ORAL | Status: DC | PRN

## 2011-10-09 NOTE — Telephone Encounter (Signed)
Faxed refill request   

## 2011-10-09 NOTE — Telephone Encounter (Signed)
rx called into pharmacy

## 2011-10-09 NOTE — Telephone Encounter (Signed)
Okay #120 x 0 

## 2011-10-26 ENCOUNTER — Other Ambulatory Visit: Payer: Self-pay | Admitting: Internal Medicine

## 2011-11-09 IMAGING — CT CT ABD-PELV W/ CM
1 of 3 series · 14 of 32 positions shown, 19 images · IV contrast (isovue)
Comparison: None

REASON FOR EXAM: abd pain
COMMENTS:

PROCEDURE:     CT  - CT ABDOMEN / PELVIS  W  - November 03, 2009  [DATE]
RESULT:     History: Abdominal pain
TECHNIQUE: Multiple axial images of the abdomen and pelvis were performed
from the lung bases to the pubic symphysis, with p.o. contrast and with 100
ml of Isovue 370 intravenous contrast.

[Series 2: abdomen · axial · 0.70mm/px · z∈[+312,+662]mm · 14 of 80 slices shown, 19 images]
[im 5/80  soft-tissue]
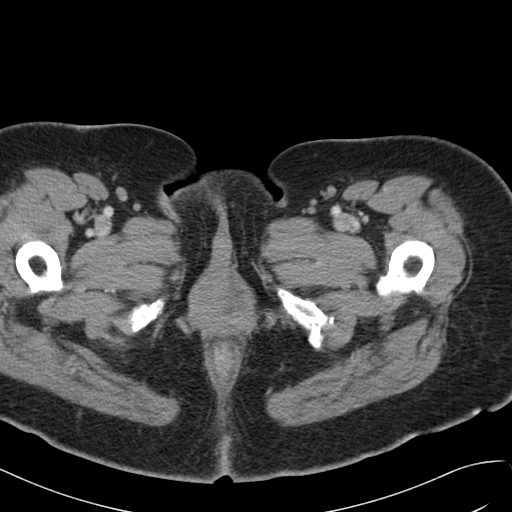
[im 5/80  bone]
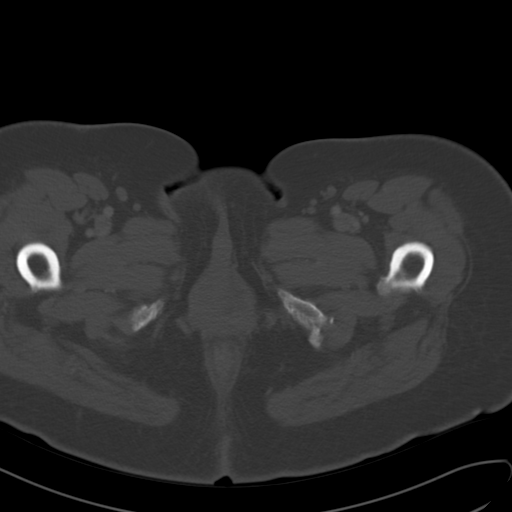
[im 9/80  soft-tissue]
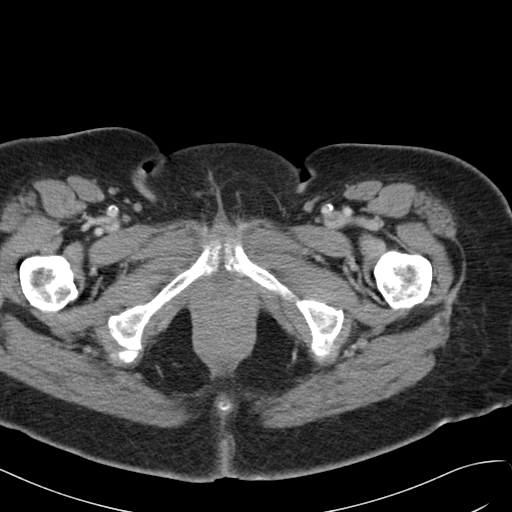
[im 18/80  soft-tissue]
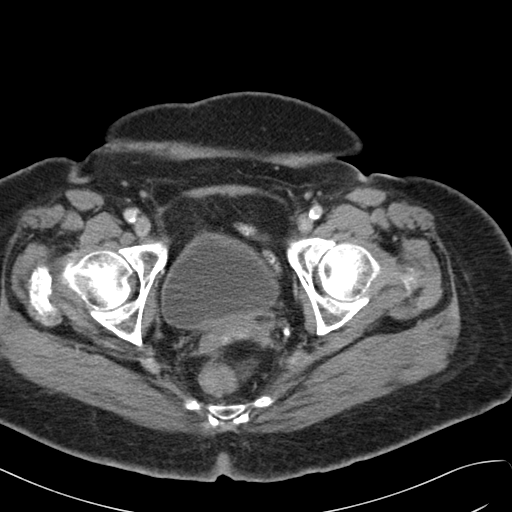
[im 22/80  soft-tissue]
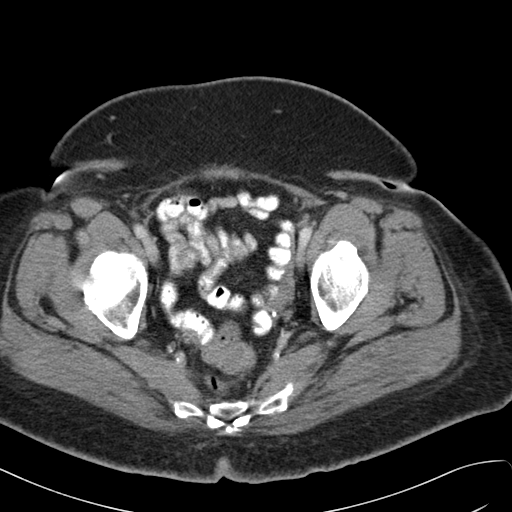
[im 27/80  soft-tissue]
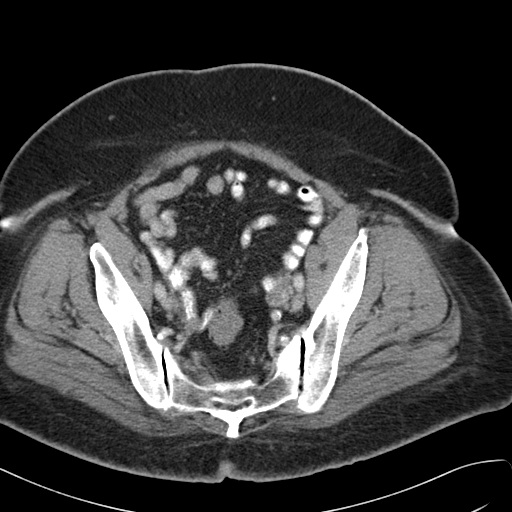
[im 36/80  soft-tissue]
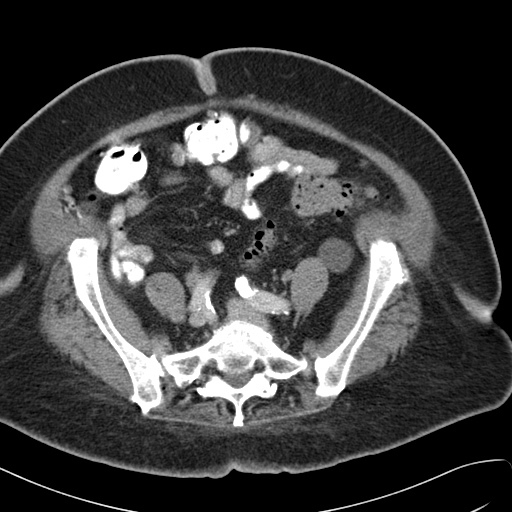
[im 40/80  soft-tissue]
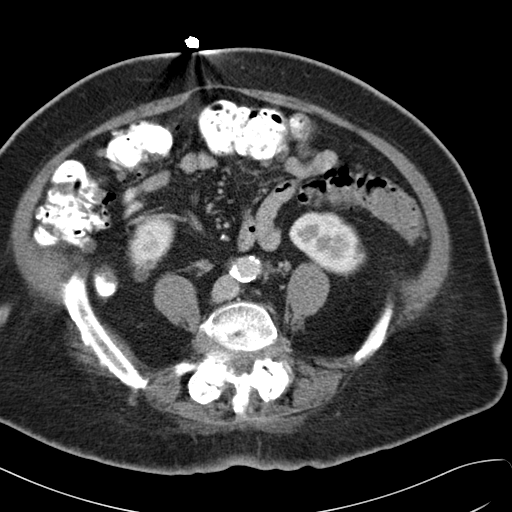
[im 44/80  soft-tissue]
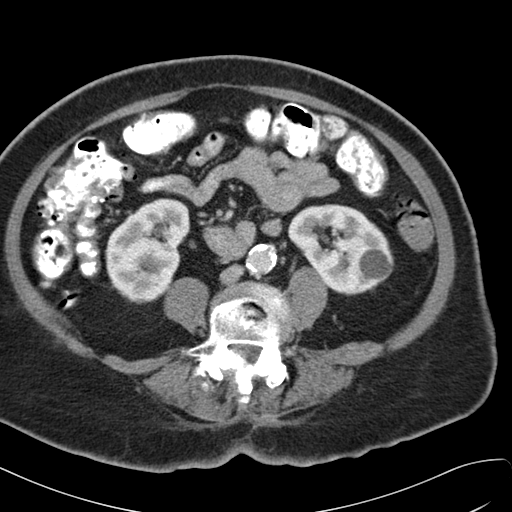
[im 53/80  soft-tissue]
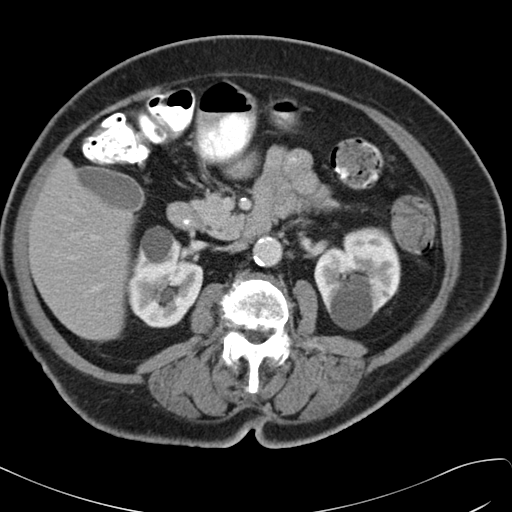
[im 53/80  bone]
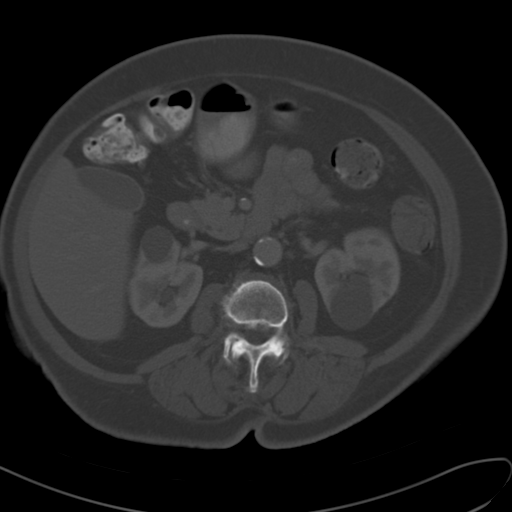
[im 58/80  soft-tissue]
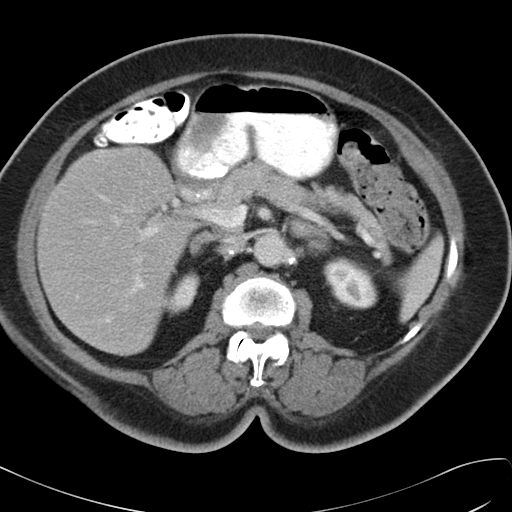
[im 62/80  soft-tissue]
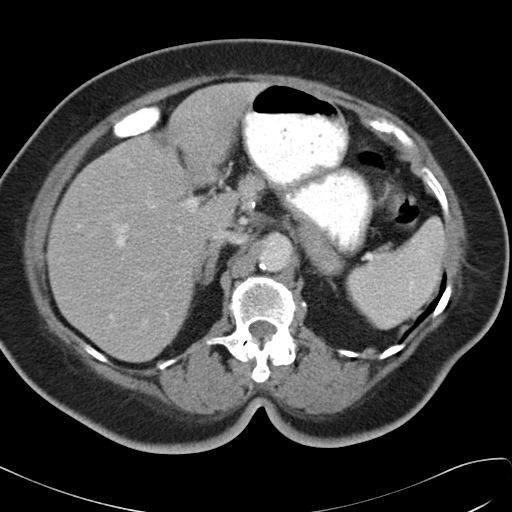
[im 62/80  lung]
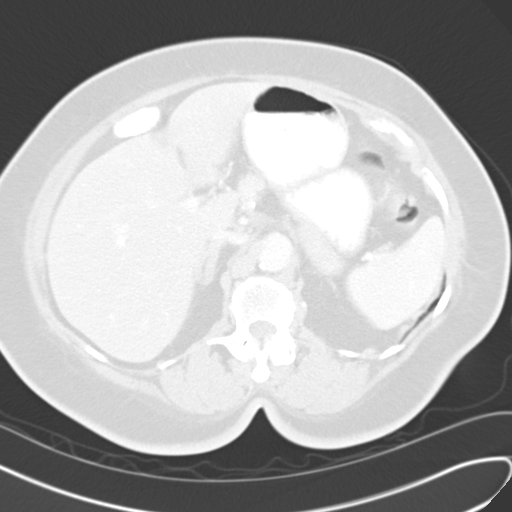
[im 66/80  lung]
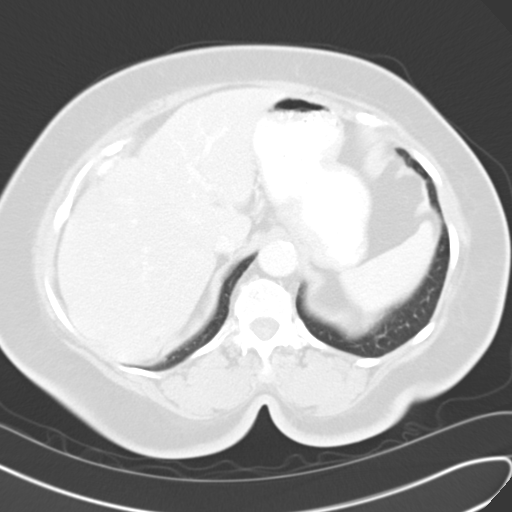
[im 71/80  soft-tissue]
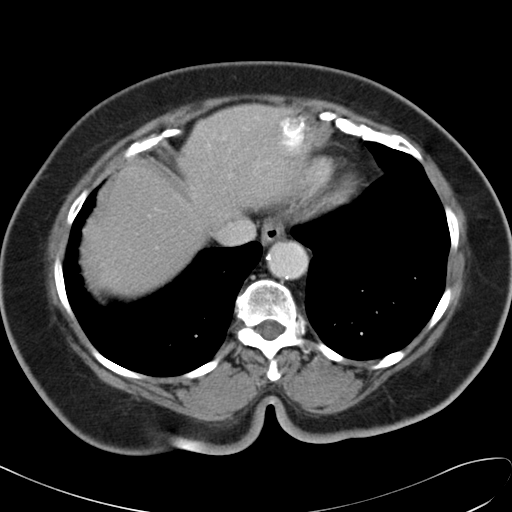
[im 71/80  lung]
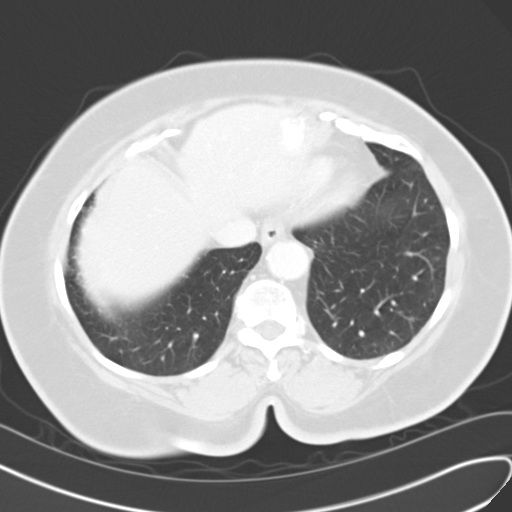
[im 75/80  soft-tissue]
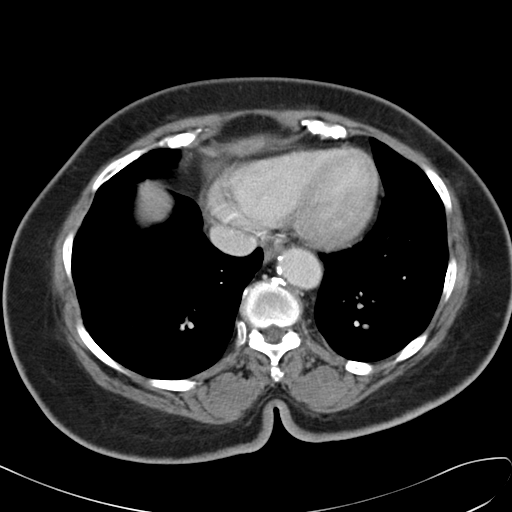
[im 75/80  lung]
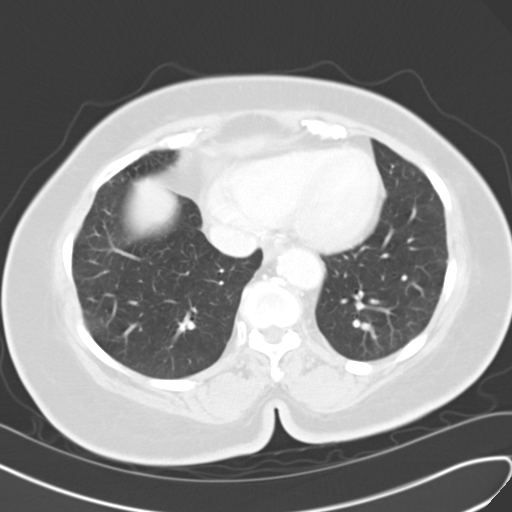

[14 of 32 positions shown; findings below may reference images not displayed]

FINDINGS: The lung bases are clear. There is no pneumothorax. The heart size is
normal.

The liver demonstrates no focal abnormality. There is no intrahepatic or
extrahepatic biliary ductal dilatation. The gallbladder is unremarkable. The
spleen demonstrates no focal abnormality. The  adrenal glands, and pancreas
are normal. There is a 3.1 cm hypodense, fluid attenuating left renal mass
most consistent with a cyst. There is a 2.1 cm hypodense, fluid attenuating
right renal mass most consistent with a cyst. There are additional hypodense
fluid attenuating masses in the inferior pole of the left kidney likely
representing cysts. The bladder is unremarkable.

The stomach, duodenum, small intestine, and large intestine demonstrate no
contrast extravasation or dilatation. There is a normal caliber appendix in
the right lower quadrant without periappendiceal inflammatory changes. There
is no pneumoperitoneum, pneumatosis, or portal venous gas. There is no
abdominal or pelvic free fluid. There is no lymphadenopathy.

The abdominal aorta is normal in caliber with atherosclerosis.

There are degenerative changes of bilateral SI joints.
IMPRESSION: No acute abdominal or pelvic pathology.

## 2011-11-10 ENCOUNTER — Other Ambulatory Visit: Payer: Self-pay | Admitting: *Deleted

## 2011-11-10 MED ORDER — HYDROCODONE-ACETAMINOPHEN 5-325 MG PO TABS: 1.0000 | ORAL_TABLET | Freq: Four times a day (QID) | ORAL | Status: DC | PRN

## 2011-11-10 NOTE — Telephone Encounter (Signed)
rx called into pharmacy

## 2011-11-10 NOTE — Telephone Encounter (Signed)
Okay #120 x 0 

## 2011-11-16 ENCOUNTER — Telehealth: Payer: Self-pay

## 2011-11-16 NOTE — Telephone Encounter (Signed)
Needs to get home health nursing orders from the surgeon--Dr Evette Cristal I think I would approve the social worker but surgeon needs to give orders for wound care

## 2011-11-16 NOTE — Telephone Encounter (Signed)
Jessica Nash with [facility] request referral for skilled nursing home health eval and social worker eval. Pt has recent surgical site on breast that has not healed.Please advise.

## 2011-11-16 NOTE — Telephone Encounter (Signed)
Spoke with nurse and advised results, she will try to get social worker approved, not home health.

## 2011-12-05 ENCOUNTER — Other Ambulatory Visit: Payer: Self-pay | Admitting: *Deleted

## 2011-12-05 MED ORDER — RANITIDINE HCL 300 MG PO TABS: 300.0000 mg | ORAL_TABLET | Freq: Two times a day (BID) | ORAL | Status: DC

## 2011-12-07 ENCOUNTER — Encounter: Payer: Self-pay | Admitting: Internal Medicine

## 2011-12-07 ENCOUNTER — Ambulatory Visit (INDEPENDENT_AMBULATORY_CARE_PROVIDER_SITE_OTHER): Payer: PRIVATE HEALTH INSURANCE | Admitting: Internal Medicine

## 2011-12-07 VITALS — BP 140/80 | HR 64 | Temp 98.0°F | Ht 65.0 in | Wt 203.0 lb

## 2011-12-07 DIAGNOSIS — E119 Type 2 diabetes mellitus without complications: Secondary | ICD-10-CM

## 2011-12-07 DIAGNOSIS — M5137 Other intervertebral disc degeneration, lumbosacral region: Secondary | ICD-10-CM

## 2011-12-07 DIAGNOSIS — I10 Essential (primary) hypertension: Secondary | ICD-10-CM

## 2011-12-07 DIAGNOSIS — K5909 Other constipation: Secondary | ICD-10-CM

## 2011-12-07 DIAGNOSIS — I2510 Atherosclerotic heart disease of native coronary artery without angina pectoris: Secondary | ICD-10-CM

## 2011-12-07 NOTE — Assessment & Plan Note (Signed)
No angina Sees Dr Welton Flakes Ready for her yearly echo and stress test---discussed that this is not really appropriate

## 2011-12-07 NOTE — Assessment & Plan Note (Signed)
BP Readings from Last 3 Encounters:  12/07/11 140/80  06/08/11 148/82  12/05/10 148/80   Reasonable control No changes needed

## 2011-12-07 NOTE — Progress Notes (Signed)
Subjective:    Patient ID: Jessica Nash, female    DOB: 06/07/1932, 78 y.o.   MRN: [id number]  HPI Here with daughter Jessica Nash Had to have scar excised from left breast  Healing well Still just on the arimidex  On the atorvastatin No myalgias Does think all her pills bother her stomach--but does seem worse after starting the atorvastatin  Has ongoing pain--legs are worse Uses the hydrocodone tid usually  Troubled with nocturia No real change No orthopnea or PND No chest pain No SOB unless she goes too fast (like going up steps)  She does her ADLs and helps a lot in house  Not checking sugars regularly No hypoglycemia  Current Outpatient Prescriptions on File Prior to Visit  Medication Sig Dispense Refill  . amLODipine-benazepril (LOTREL) 10-20 MG per capsule Take 1 capsule by mouth daily.  30 capsule  11  . anastrozole (ARIMIDEX) 1 MG tablet Take 1 mg by mouth daily.        Marland Kitchen aspirin 81 MG tablet Take 81 mg by mouth daily.        Marland Kitchen atorvastatin (LIPITOR) 10 MG tablet Take 1 tablet (10 mg total) by mouth daily.  30 tablet  11  . brimonidine-timolol (COMBIGAN) 0.2-0.5 % ophthalmic solution Place 1 drop into both eyes every 12 (twelve) hours.        . Calcium Carbonate-Vitamin D (CALCIUM-VITAMIN D) 500-200 MG-UNIT per tablet Take 1 tablet by mouth 2 (two) times daily with a meal.        . clopidogrel (PLAVIX) 75 MG tablet Take 75 mg by mouth daily.        . dorzolamide (TRUSOPT) 2 % ophthalmic solution Place 1 drop into both eyes 2 (two) times daily.        Marland Kitchen glipiZIDE (GLUCOTROL) 5 MG tablet TAKE 1 TABLET BY MOUTH DAILY  90 tablet  2  . HYDROcodone-acetaminophen (NORCO) 5-325 MG per tablet Take 1 tablet by mouth 4 (four) times daily as needed for pain.  120 tablet  0  . metoprolol (LOPRESSOR) 100 MG tablet TAKE 1 TABLET BY MOUTH TWICE A DAY  60 tablet  11  . polyethylene glycol powder (MIRALAX) powder Take 17 g by mouth daily.        . ranitidine (ZANTAC) 300 MG tablet Take 1  tablet (300 mg total) by mouth 2 (two) times daily.  60 tablet  12  . sennosides-docusate sodium (SENOKOT-S) 8.6-50 MG tablet Take 1 tablet by mouth daily.        . travoprost, benzalkonium, (TRAVATAN) 0.004 % ophthalmic solution Place 1 drop into both eyes 2 (two) times daily.          No Known Allergies  Past Medical History  Diagnosis Date  . Breast cancer metastasized to axillary lymph node 4/12    Papillary carcinoma. 2/3 sentinel nodes positive. Dr Jessica Nash  . Hx of colonic polyps   . Diabetes mellitus   . Diverticulitis   . GERD (gastroesophageal reflux disease)   . Hypertension   . Glaucoma   . Degenerative disc disease   . Hyperlipidemia   . CAD (coronary artery disease)   . Breast cyst   . Hx of decompressive lumbar laminectomy     3 Level lumbar decompressive laminotomies with  foraminotomies 02/06  . Breast cancer     Past Surgical History  Procedure Date  . Abdominal hysterectomy   . Glaucoma surgery 01/03-03/03  . Angioplasty     2/10 Angioplasty/stent-RCA---------Dr Jessica Nash  .  Mastectomy 4/12    Dr Jessica Nash RT after    No family history on file.  History   Social History  . Marital Status: Widowed    Spouse Name: N/A    Number of Children: 5  . Years of Education: N/A   Occupational History  . retired as Artist    Social History Main Topics  . Smoking status: Former Smoker    Quit date: 05/30/2007  . Smokeless tobacco: Never Used  . Alcohol Use: No  . Drug Use: No  . Sexually Active: Not on file   Other Topics Concern  . Not on file   Social History Narrative   Jessica Nash mom   Review of Systems Sleep is fair despite the nocturia. Takes a while to get going. Does nap Appetite is variable Weight stable    Objective:   Physical Exam  Constitutional: She appears well-developed and well-nourished. No distress.  Eyes:       blind  Neck: Normal range of motion. Neck supple.  Cardiovascular: Normal rate,  regular rhythm, normal heart sounds and intact distal pulses.  Exam reveals no gallop.   No murmur heard. Pulmonary/Chest: Effort normal and breath sounds normal. No respiratory distress. She has no wheezes. She has no rales.  Musculoskeletal: She exhibits no edema and no tenderness.  Lymphadenopathy:    She has no cervical adenopathy.  Skin: No rash noted.       Mycotic toenails  Psychiatric: She has a normal mood and affect. Her behavior is normal.          Assessment & Plan:

## 2011-12-07 NOTE — Assessment & Plan Note (Signed)
Does okay with regular hydrocodone

## 2011-12-07 NOTE — Assessment & Plan Note (Signed)
Not checking but has had good control Will check A1c

## 2011-12-07 NOTE — Assessment & Plan Note (Signed)
Discussed adding miralax

## 2011-12-07 NOTE — Patient Instructions (Signed)
Please stop the atorvastain for the next couple of weeks to see if that makes your stomach better

## 2011-12-11 ENCOUNTER — Encounter: Payer: Self-pay | Admitting: *Deleted

## 2011-12-13 ENCOUNTER — Ambulatory Visit: Payer: Self-pay | Admitting: Internal Medicine

## 2011-12-18 ENCOUNTER — Other Ambulatory Visit: Payer: Self-pay | Admitting: *Deleted

## 2011-12-18 MED ORDER — HYDROCODONE-ACETAMINOPHEN 5-325 MG PO TABS: 1.0000 | ORAL_TABLET | Freq: Four times a day (QID) | ORAL | Status: DC | PRN

## 2011-12-18 NOTE — Telephone Encounter (Signed)
plz phone in. 

## 2011-12-18 NOTE — Telephone Encounter (Signed)
rx called into pharmacy

## 2011-12-18 NOTE — Telephone Encounter (Signed)
LETV[facility] PATIENT, ok to fill? 

## 2011-12-28 ENCOUNTER — Ambulatory Visit: Payer: Self-pay | Admitting: Internal Medicine

## 2012-01-08 ENCOUNTER — Other Ambulatory Visit: Payer: Self-pay | Admitting: Internal Medicine

## 2012-02-01 ENCOUNTER — Other Ambulatory Visit: Payer: Self-pay | Admitting: *Deleted

## 2012-02-01 MED ORDER — HYDROCODONE-ACETAMINOPHEN 5-325 MG PO TABS: 1.0000 | ORAL_TABLET | Freq: Four times a day (QID) | ORAL | Status: DC | PRN

## 2012-02-01 NOTE — Telephone Encounter (Signed)
Error

## 2012-02-01 NOTE — Telephone Encounter (Signed)
rx called into pharmacy

## 2012-02-01 NOTE — Telephone Encounter (Signed)
Okay #120 x 0 Needs follow up in January

## 2012-03-04 ENCOUNTER — Other Ambulatory Visit: Payer: Self-pay | Admitting: Family Medicine

## 2012-03-04 ENCOUNTER — Other Ambulatory Visit: Payer: Self-pay | Admitting: Internal Medicine

## 2012-03-05 NOTE — Telephone Encounter (Signed)
rx called into pharmacy

## 2012-03-05 NOTE — Telephone Encounter (Signed)
Okay #120 x 0 Due for appt in January

## 2012-03-05 NOTE — Telephone Encounter (Signed)
To pcp

## 2012-04-23 ENCOUNTER — Other Ambulatory Visit: Payer: Self-pay | Admitting: Internal Medicine

## 2012-04-24 ENCOUNTER — Other Ambulatory Visit: Payer: Self-pay | Admitting: *Deleted

## 2012-04-24 NOTE — Telephone Encounter (Signed)
Opened in error

## 2012-04-24 NOTE — Telephone Encounter (Signed)
rx called into pharmacy

## 2012-05-21 ENCOUNTER — Other Ambulatory Visit: Payer: Self-pay | Admitting: *Deleted

## 2012-05-21 MED ORDER — ATORVASTATIN CALCIUM 10 MG PO TABS: 10.0000 mg | ORAL_TABLET | Freq: Every day | ORAL | Status: DC

## 2012-06-15 ENCOUNTER — Other Ambulatory Visit: Payer: Self-pay | Admitting: Family Medicine

## 2012-06-17 NOTE — Telephone Encounter (Signed)
rx called into pharmacy

## 2012-06-17 NOTE — Telephone Encounter (Signed)
Okay #120 x 0 

## 2012-06-19 ENCOUNTER — Encounter: Payer: Self-pay | Admitting: Internal Medicine

## 2012-06-19 ENCOUNTER — Ambulatory Visit (INDEPENDENT_AMBULATORY_CARE_PROVIDER_SITE_OTHER): Payer: PRIVATE HEALTH INSURANCE | Admitting: Internal Medicine

## 2012-06-19 VITALS — BP 144/80 | HR 86 | Temp 98.1°F | Wt 208.0 lb

## 2012-06-19 DIAGNOSIS — I2510 Atherosclerotic heart disease of native coronary artery without angina pectoris: Secondary | ICD-10-CM

## 2012-06-19 DIAGNOSIS — M5137 Other intervertebral disc degeneration, lumbosacral region: Secondary | ICD-10-CM

## 2012-06-19 DIAGNOSIS — IMO0001 Reserved for inherently not codable concepts without codable children: Secondary | ICD-10-CM

## 2012-06-19 DIAGNOSIS — E785 Hyperlipidemia, unspecified: Secondary | ICD-10-CM

## 2012-06-19 DIAGNOSIS — M791 Myalgia, unspecified site: Secondary | ICD-10-CM

## 2012-06-19 DIAGNOSIS — E119 Type 2 diabetes mellitus without complications: Secondary | ICD-10-CM

## 2012-06-19 DIAGNOSIS — I10 Essential (primary) hypertension: Secondary | ICD-10-CM

## 2012-06-19 LAB — TSH: TSH: 0.77 u[IU]/mL (ref 0.35–5.50)

## 2012-06-19 LAB — BASIC METABOLIC PANEL
BUN: 10 mg/dL (ref 6–23)
CO2: 31 mEq/L (ref 19–32)
Calcium: 10.1 mg/dL (ref 8.4–10.5)
Creatinine, Ser: 0.8 mg/dL (ref 0.4–1.2)
Glucose, Bld: 229 mg/dL — ABNORMAL HIGH (ref 70–99)

## 2012-06-19 LAB — CBC WITH DIFFERENTIAL/PLATELET
Basophils Absolute: 0 10*3/uL (ref 0.0–0.1)
Eosinophils Absolute: 0.1 10*3/uL (ref 0.0–0.7)
Lymphocytes Relative: 51.2 % — ABNORMAL HIGH (ref 12.0–46.0)
MCHC: 32.7 g/dL (ref 30.0–36.0)
Neutrophils Relative %: 39.4 % — ABNORMAL LOW (ref 43.0–77.0)
Platelets: 247 10*3/uL (ref 150.0–400.0)
RBC: 4.66 Mil/uL (ref 3.87–5.11)
RDW: 14.3 % (ref 11.5–14.6)

## 2012-06-19 LAB — HEPATIC FUNCTION PANEL
ALT: 13 U/L (ref 0–35)
AST: 16 U/L (ref 0–37)
Albumin: 4 g/dL (ref 3.5–5.2)
Alkaline Phosphatase: 41 U/L (ref 39–117)

## 2012-06-19 LAB — LDL CHOLESTEROL, DIRECT: Direct LDL: 48.9 mg/dL

## 2012-06-19 LAB — LIPID PANEL
Cholesterol: 112 mg/dL (ref 0–200)
Total CHOL/HDL Ratio: 3
Triglycerides: 219 mg/dL — ABNORMAL HIGH (ref 0.0–149.0)

## 2012-06-19 LAB — SEDIMENTATION RATE: Sed Rate: 21 mm/hr (ref 0–22)

## 2012-06-19 LAB — CK: Total CK: 105 U/L (ref 7–177)

## 2012-06-19 NOTE — Assessment & Plan Note (Signed)
Will consider trial off for 1 month if ESR normal

## 2012-06-19 NOTE — Assessment & Plan Note (Signed)
Hydrocodone helps Discussed bowel regimen Gets sleepy---may just need more stimulation (tough due to blindness)

## 2012-06-19 NOTE — Assessment & Plan Note (Signed)
Doesn't check sugars Will do A1c today

## 2012-06-19 NOTE — Assessment & Plan Note (Signed)
Seems to be more of a problem ?PMR ?from statin  If ESR is normal, will have trial of holding the statin

## 2012-06-19 NOTE — Assessment & Plan Note (Signed)
BP Readings from Last 3 Encounters:  06/19/12 144/80  12/07/11 140/80  06/08/11 148/82   Reasonable control No changes

## 2012-06-19 NOTE — Assessment & Plan Note (Signed)
No real chest pain but has DOE Sounds mostly like deconditioning Discussed trying chair exercises with hand and foot weights

## 2012-06-19 NOTE — Progress Notes (Signed)
Subjective:    Patient ID: Jessica Nash, female    DOB: 1932/12/05, 79 y.o.   MRN: [id number]  HPI Here with daughter Jessica Nash  Daughter is concerned about DOE but she isn't Has trouble walking in parking lot to car Patient thinks it is more her back pain  No SOB at rest Sleeps prppped up but no PND  Having bad arm pain Legs and rest of body at times---but arms are worst (she feels it is because she sleeps on them) They do improve after being up a while  Ongoing back pain Hydrocodone helps but makes her sleepy  Not happy because of this  Constipated  Senna regularly but still having problems  Hasn't been checking sugars No hypoglycemia  Current Outpatient Prescriptions on File Prior to Visit  Medication Sig Dispense Refill  . amLODipine-benazepril (LOTREL) 10-20 MG per capsule Take 1 capsule by mouth daily.      Marland Kitchen anastrozole (ARIMIDEX) 1 MG tablet Take 1 mg by mouth daily.        Marland Kitchen aspirin 81 MG tablet Take 81 mg by mouth daily.        Marland Kitchen atorvastatin (LIPITOR) 10 MG tablet Take 1 tablet (10 mg total) by mouth daily.  30 tablet  5  . brimonidine-timolol (COMBIGAN) 0.2-0.5 % ophthalmic solution Place 1 drop into both eyes every 12 (twelve) hours.        . Calcium Carbonate-Vitamin D (CALCIUM-VITAMIN D) 500-200 MG-UNIT per tablet Take 1 tablet by mouth 2 (two) times daily with a meal.        . clopidogrel (PLAVIX) 75 MG tablet TAKE 1 TABLET EVERY DAY  30 tablet  11  . [facility] ASPIRIN EC 81 MG EC tablet TAKE 1 TABLET BY MOUTH EVERY DAY  30 tablet  11  . dorzolamide (TRUSOPT) 2 % ophthalmic solution Place 1 drop into both eyes 2 (two) times daily.        Marland Kitchen glipiZIDE (GLUCOTROL) 5 MG tablet TAKE 1 TABLET BY MOUTH DAILY  90 tablet  2  . HYDROcodone-acetaminophen (NORCO/VICODIN) 5-325 MG per tablet TAKE 1 TABLET BY MOUTH 4 TIMES A DAY AS NEEDED  120 tablet  0  . metoprolol (LOPRESSOR) 100 MG tablet TAKE 1 TABLET BY MOUTH TWICE A DAY  60 tablet  11  . ranitidine (ZANTAC) 300 MG tablet  Take 1 tablet (300 mg total) by mouth 2 (two) times daily.  60 tablet  12  . sennosides-docusate sodium (SENOKOT-S) 8.6-50 MG tablet Take 2 tablets by mouth daily.       . travoprost, benzalkonium, (TRAVATAN) 0.004 % ophthalmic solution Place 1 drop into both eyes 2 (two) times daily.          No Known Allergies  Past Medical History  Diagnosis Date  . Breast cancer metastasized to axillary lymph node 4/12    Papillary carcinoma. 2/3 sentinel nodes positive. Dr Lemar Livings  . Hx of colonic polyps   . Diabetes mellitus   . Diverticulitis   . GERD (gastroesophageal reflux disease)   . Hypertension   . Glaucoma(365)   . Degenerative disc disease   . Hyperlipidemia   . CAD (coronary artery disease)   . Breast cyst   . Hx of decompressive lumbar laminectomy     3 Level lumbar decompressive laminotomies with  foraminotomies 02/06  . Breast cancer     Past Surgical History  Procedure Date  . Abdominal hysterectomy   . Glaucoma surgery 01/03-03/03  . Angioplasty  2/10 Angioplasty/stent-RCA---------Dr Arida  . Mastectomy 4/12    Dr Lauree Chandler RT after    No family history on file.  History   Social History  . Marital Status: Widowed    Spouse Name: N/A    Number of Children: 5  . Years of Education: N/A   Occupational History  . retired as Artist    Social History Main Topics  . Smoking status: Former Smoker    Quit date: 05/30/2007  . Smokeless tobacco: Never Used  . Alcohol Use: No  . Drug Use: No  . Sexually Active: Not on file   Other Topics Concern  . Not on file   Social History Narrative   Lyn Hollingshead mom   Review of Systems Appetite is okay Weight is up 5# since the summer Niece has moved in and helps care for her (cooks also)     Objective:   Physical Exam  Constitutional: She appears well-developed and well-nourished. No distress.  Neck: Normal range of motion. Neck supple.  Cardiovascular: Normal rate, regular  rhythm and normal heart sounds.  Exam reveals no gallop.   No murmur heard. Pulmonary/Chest: Effort normal and breath sounds normal. No respiratory distress. She has no wheezes. She has no rales.  Abdominal: Soft. There is no tenderness.  Musculoskeletal: She exhibits no edema.  Lymphadenopathy:    She has no cervical adenopathy.  Psychiatric: She has a normal mood and affect. Her behavior is normal.          Assessment & Plan:

## 2012-06-25 ENCOUNTER — Other Ambulatory Visit: Payer: Self-pay | Admitting: Internal Medicine

## 2012-07-11 ENCOUNTER — Other Ambulatory Visit: Payer: Self-pay | Admitting: Internal Medicine

## 2012-07-19 ENCOUNTER — Telehealth: Payer: Self-pay | Admitting: Internal Medicine

## 2012-07-19 NOTE — Telephone Encounter (Signed)
Not really Have her let her mom know that I wish her the best

## 2012-07-19 NOTE — Telephone Encounter (Signed)
Patient is moving to [place] on Saturday.  Jessica Nash will be requesting records from our office.  Jessica Nash wants to know if there is anything else she should do before her mother moves.

## 2012-07-20 ENCOUNTER — Other Ambulatory Visit: Payer: Self-pay | Admitting: Internal Medicine

## 2012-07-22 NOTE — Telephone Encounter (Signed)
rx called into pharmacy

## 2012-07-22 NOTE — Telephone Encounter (Signed)
Please check with [name] about this She just moved back up to [facility]

## 2012-07-22 NOTE — Telephone Encounter (Signed)
Okay #120 x 0 

## 2012-07-22 NOTE — Telephone Encounter (Signed)
Patient is moving on Saturday, ok to refill?

## 2012-08-19 ENCOUNTER — Other Ambulatory Visit: Payer: Self-pay

## 2012-08-19 ENCOUNTER — Other Ambulatory Visit: Payer: Self-pay | Admitting: Internal Medicine

## 2012-08-19 NOTE — Telephone Encounter (Signed)
[name] Long calls back and pts son called [name] back and pt is out of pain med and wants [name] to overnight the pain med to pt. Francine request meds called in today or tonight.

## 2012-08-19 NOTE — Telephone Encounter (Signed)
Jessica Nash, pts daughter request refill hydrocodone apap and amlodipine- benzapril 10- 20 mg to [facility]. Pt has moved to [place] and has not gotten physician there. Pt is almost out of med; [facility] had been prescribing amlodipine-benzapril but has not responded to refill request so [name] ask if Dr Alphonsus Sias would refill.Please advise.[name] will have meds transferred from [facility] to a pharmacy in [place].

## 2012-08-20 MED ORDER — AMLODIPINE BESY-BENAZEPRIL HCL 10-20 MG PO CAPS: 1.0000 | ORAL_CAPSULE | Freq: Every day | ORAL | Status: DC

## 2012-08-20 MED ORDER — HYDROCODONE-ACETAMINOPHEN 5-325 MG PO TABS: ORAL_TABLET | ORAL | Status: DC

## 2012-08-20 NOTE — Telephone Encounter (Signed)
rx called into pharmacy Spoke with daughter and advised results.  

## 2012-08-20 NOTE — Telephone Encounter (Signed)
Okay lotrel x 3 months Hydrocodone #120 x 0 This is the last prescription I will do---she must establish with physician in [place]

## 2012-10-17 ENCOUNTER — Ambulatory Visit: Payer: PRIVATE HEALTH INSURANCE | Admitting: Internal Medicine

## 2012-11-20 ENCOUNTER — Encounter: Payer: Self-pay | Admitting: *Deleted

## 2012-11-20 DIAGNOSIS — H547 Unspecified visual loss: Secondary | ICD-10-CM | POA: Insufficient documentation

## 2012-12-23 ENCOUNTER — Other Ambulatory Visit: Payer: Self-pay | Admitting: Internal Medicine

## 2013-05-20 ENCOUNTER — Ambulatory Visit: Payer: Self-pay | Admitting: General Surgery

## 2013-07-02 ENCOUNTER — Encounter: Payer: Self-pay | Admitting: *Deleted

## 2013-07-09 ENCOUNTER — Other Ambulatory Visit: Payer: Self-pay | Admitting: Internal Medicine

## 2013-07-21 ENCOUNTER — Other Ambulatory Visit: Payer: Self-pay | Admitting: Internal Medicine

## 2013-08-26 ENCOUNTER — Other Ambulatory Visit: Payer: Self-pay | Admitting: Internal Medicine

## 2013-09-02 ENCOUNTER — Other Ambulatory Visit: Payer: Self-pay | Admitting: Internal Medicine

## 2013-09-05 ENCOUNTER — Other Ambulatory Visit: Payer: Self-pay | Admitting: Internal Medicine

## 2014-03-30 ENCOUNTER — Encounter: Payer: Self-pay | Admitting: *Deleted

## 2014-09-20 NOTE — Op Note (Signed)
PATIENT NAME:  Jessica Nash, Jessica Nash MR#:  [id number] DATE OF BIRTH:  1932/12/10  DATE OF PROCEDURE:  09/20/2011  PREOPERATIVE DIAGNOSIS: Nonhealing wound, left breast.   POSTOPERATIVE DIAGNOSIS: Nonhealing wound, left breast.  OPERATIVE PROCEDURE: Excision of left breast wound with primary closure.   SURGEON: Hervey Ard, MD   ANESTHESIA: General by mask, Dr. Benjamine Mola, Marcaine 0.5% plain, 30 mL local infiltration.   ANESTHESIOLOGIST: Elyse Hsu, MD   ESTIMATED BLOOD LOSS: Minimal.   CLINICAL NOTE: This 81 year old woman had undergone a partial mastectomy and MammoSite radiation. Approximately 12 months post procedure she developed a nonhealing ulcer near the previous radiation site. Conservative measures with local debridement have been unsuccessful. She was felt to be a candidate for excision and primary closure. The patient received Kefzol prior to the procedure.   OPERATIVE NOTE: With the patient under adequate general anesthesia, the area was prepped with Betadine and draped. The wound was evaluated with ultrasound, and a distinct change in the appearance of the subcutaneous fat was evident extending out 1.5 cm from the edge of the ulcer. A 5 x 10 cm elliptical incision was carried down to the fascia and orientated and sent in formalin for routine histology. The breast was elevated off the underlying pectoralis fascia and approximated with interrupted 2-0 Vicryl figure-of-eight sutures in multiple layers. The skin was then closed with a running 4-0 Vicryl subcuticular suture. Benzoin and Steri-Strips were applied, followed by a Telfa dressing, fluffed gauze, Kerlix and Ace wrap.        The patient tolerated the procedure well and was taken to the recovery room in stable condition.  ____________________________ Robert Bellow, MD jwb:cbb D: 09/20/2011 12:00:37 ET T: 09/20/2011 12:13:10 ET JOB#: [id number]  cc: Robert Bellow, MD, <Dictator> Armstead Peaks, MD Venia Carbon,  MD JEFFREY Amedeo Kinsman MD ELECTRONICALLY SIGNED 09/21/2011 16:18

## 2021-04-20 ENCOUNTER — Other Ambulatory Visit: Payer: Self-pay

## 2021-04-20 ENCOUNTER — Emergency Department: Payer: Medicare Other

## 2021-04-20 ENCOUNTER — Inpatient Hospital Stay
Admission: EM | Admit: 2021-04-20 | Discharge: 2021-04-26 | DRG: 189 | Disposition: A | Discharge status: Home Health | Payer: Medicare Other | Attending: Internal Medicine | Admitting: Internal Medicine

## 2021-04-20 DIAGNOSIS — Z79811 Long term (current) use of aromatase inhibitors: Secondary | ICD-10-CM

## 2021-04-20 DIAGNOSIS — I5031 Acute diastolic (congestive) heart failure: Secondary | ICD-10-CM | POA: Diagnosis not present

## 2021-04-20 DIAGNOSIS — C50919 Malignant neoplasm of unspecified site of unspecified female breast: Secondary | ICD-10-CM

## 2021-04-20 DIAGNOSIS — Z800 Family history of malignant neoplasm of digestive organs: Secondary | ICD-10-CM

## 2021-04-20 DIAGNOSIS — Z7984 Long term (current) use of oral hypoglycemic drugs: Secondary | ICD-10-CM

## 2021-04-20 DIAGNOSIS — I5033 Acute on chronic diastolic (congestive) heart failure: Secondary | ICD-10-CM | POA: Diagnosis present

## 2021-04-20 DIAGNOSIS — R627 Adult failure to thrive: Secondary | ICD-10-CM | POA: Diagnosis present

## 2021-04-20 DIAGNOSIS — Z9011 Acquired absence of right breast and nipple: Secondary | ICD-10-CM

## 2021-04-20 DIAGNOSIS — J9601 Acute respiratory failure with hypoxia: Principal | ICD-10-CM

## 2021-04-20 DIAGNOSIS — I2510 Atherosclerotic heart disease of native coronary artery without angina pectoris: Secondary | ICD-10-CM | POA: Diagnosis present

## 2021-04-20 DIAGNOSIS — Z79899 Other long term (current) drug therapy: Secondary | ICD-10-CM | POA: Diagnosis not present

## 2021-04-20 DIAGNOSIS — J189 Pneumonia, unspecified organism: Secondary | ICD-10-CM | POA: Diagnosis present

## 2021-04-20 DIAGNOSIS — Z515 Encounter for palliative care: Secondary | ICD-10-CM | POA: Diagnosis not present

## 2021-04-20 DIAGNOSIS — J9602 Acute respiratory failure with hypercapnia: Secondary | ICD-10-CM

## 2021-04-20 DIAGNOSIS — Z7902 Long term (current) use of antithrombotics/antiplatelets: Secondary | ICD-10-CM

## 2021-04-20 DIAGNOSIS — J9621 Acute and chronic respiratory failure with hypoxia: Secondary | ICD-10-CM | POA: Diagnosis present

## 2021-04-20 DIAGNOSIS — I4510 Unspecified right bundle-branch block: Secondary | ICD-10-CM | POA: Diagnosis present

## 2021-04-20 DIAGNOSIS — E8729 Other acidosis: Secondary | ICD-10-CM | POA: Diagnosis present

## 2021-04-20 DIAGNOSIS — E1122 Type 2 diabetes mellitus with diabetic chronic kidney disease: Secondary | ICD-10-CM | POA: Diagnosis present

## 2021-04-20 DIAGNOSIS — J9620 Acute and chronic respiratory failure, unspecified whether with hypoxia or hypercapnia: Secondary | ICD-10-CM | POA: Diagnosis present

## 2021-04-20 DIAGNOSIS — N390 Urinary tract infection, site not specified: Secondary | ICD-10-CM | POA: Diagnosis present

## 2021-04-20 DIAGNOSIS — E785 Hyperlipidemia, unspecified: Secondary | ICD-10-CM | POA: Diagnosis present

## 2021-04-20 DIAGNOSIS — T450X5A Adverse effect of antiallergic and antiemetic drugs, initial encounter: Secondary | ICD-10-CM | POA: Diagnosis present

## 2021-04-20 DIAGNOSIS — I252 Old myocardial infarction: Secondary | ICD-10-CM

## 2021-04-20 DIAGNOSIS — G9341 Metabolic encephalopathy: Secondary | ICD-10-CM | POA: Diagnosis present

## 2021-04-20 DIAGNOSIS — N1831 Chronic kidney disease, stage 3a: Secondary | ICD-10-CM | POA: Diagnosis present

## 2021-04-20 DIAGNOSIS — Z7982 Long term (current) use of aspirin: Secondary | ICD-10-CM

## 2021-04-20 DIAGNOSIS — J9622 Acute and chronic respiratory failure with hypercapnia: Secondary | ICD-10-CM | POA: Diagnosis present

## 2021-04-20 DIAGNOSIS — R451 Restlessness and agitation: Secondary | ICD-10-CM | POA: Diagnosis not present

## 2021-04-20 DIAGNOSIS — I130 Hypertensive heart and chronic kidney disease with heart failure and stage 1 through stage 4 chronic kidney disease, or unspecified chronic kidney disease: Secondary | ICD-10-CM | POA: Diagnosis present

## 2021-04-20 DIAGNOSIS — Z66 Do not resuscitate: Secondary | ICD-10-CM | POA: Diagnosis present

## 2021-04-20 DIAGNOSIS — R410 Disorientation, unspecified: Secondary | ICD-10-CM | POA: Diagnosis not present

## 2021-04-20 DIAGNOSIS — H548 Legal blindness, as defined in USA: Secondary | ICD-10-CM | POA: Diagnosis present

## 2021-04-20 DIAGNOSIS — H409 Unspecified glaucoma: Secondary | ICD-10-CM | POA: Diagnosis present

## 2021-04-20 DIAGNOSIS — I219 Acute myocardial infarction, unspecified: Secondary | ICD-10-CM | POA: Diagnosis present

## 2021-04-20 DIAGNOSIS — Z87891 Personal history of nicotine dependence: Secondary | ICD-10-CM

## 2021-04-20 DIAGNOSIS — J9692 Respiratory failure, unspecified with hypercapnia: Secondary | ICD-10-CM | POA: Diagnosis present

## 2021-04-20 DIAGNOSIS — Z20822 Contact with and (suspected) exposure to covid-19: Secondary | ICD-10-CM | POA: Diagnosis present

## 2021-04-20 DIAGNOSIS — Z9981 Dependence on supplemental oxygen: Secondary | ICD-10-CM

## 2021-04-20 DIAGNOSIS — Z6835 Body mass index (BMI) 35.0-35.9, adult: Secondary | ICD-10-CM | POA: Diagnosis not present

## 2021-04-20 DIAGNOSIS — K219 Gastro-esophageal reflux disease without esophagitis: Secondary | ICD-10-CM | POA: Diagnosis present

## 2021-04-20 DIAGNOSIS — Z955 Presence of coronary angioplasty implant and graft: Secondary | ICD-10-CM

## 2021-04-20 DIAGNOSIS — I10 Essential (primary) hypertension: Secondary | ICD-10-CM | POA: Diagnosis present

## 2021-04-20 DIAGNOSIS — Z853 Personal history of malignant neoplasm of breast: Secondary | ICD-10-CM

## 2021-04-20 DIAGNOSIS — Z7189 Other specified counseling: Secondary | ICD-10-CM | POA: Diagnosis not present

## 2021-04-20 DIAGNOSIS — R4182 Altered mental status, unspecified: Secondary | ICD-10-CM | POA: Diagnosis present

## 2021-04-20 DIAGNOSIS — N200 Calculus of kidney: Secondary | ICD-10-CM | POA: Diagnosis present

## 2021-04-20 LAB — BLOOD GAS, ARTERIAL
Acid-Base Excess: 10.6 mmol/L — ABNORMAL HIGH (ref 0.0–2.0)
Acid-Base Excess: 10.7 mmol/L — ABNORMAL HIGH (ref 0.0–2.0)
Acid-Base Excess: 10.3 mmol/L — ABNORMAL HIGH (ref 0.0–2.0)
Acid-Base Excess: 14.1 mmol/L — ABNORMAL HIGH (ref 0.0–2.0)
Bicarbonate: 40.9 mmol/L — ABNORMAL HIGH (ref 20.0–28.0)
Bicarbonate: 41.7 mmol/L — ABNORMAL HIGH (ref 20.0–28.0)
Bicarbonate: 40.4 mmol/L — ABNORMAL HIGH (ref 20.0–28.0)
Bicarbonate: 43.7 mmol/L — ABNORMAL HIGH (ref 20.0–28.0)
Delivery systems: POSITIVE
Delivery systems: POSITIVE
Expiratory PAP: 8
Expiratory PAP: 8
FIO2: 1
FIO2: 0.5
FIO2: 0.5
FIO2: 0.45
Inspiratory PAP: 18
Inspiratory PAP: 18
O2 Saturation: 97.9 %
O2 Saturation: 92.3 %
O2 Saturation: 93.9 %
O2 Saturation: 93.5 %
Patient temperature: 37
Patient temperature: 37
Patient temperature: 37
Patient temperature: 37
RATE: 20 resp/min
RATE: 20 resp/min
pCO2 arterial: 85 mmHg (ref 32.0–48.0)
pCO2 arterial: 93 mmHg (ref 32.0–48.0)
pCO2 arterial: 84 mmHg (ref 32.0–48.0)
pCO2 arterial: 81 mmHg (ref 32.0–48.0)
pH, Arterial: 7.29 — ABNORMAL LOW (ref 7.350–7.450)
pH, Arterial: 7.26 — ABNORMAL LOW (ref 7.350–7.450)
pH, Arterial: 7.29 — ABNORMAL LOW (ref 7.350–7.450)
pH, Arterial: 7.34 — ABNORMAL LOW (ref 7.350–7.450)
pO2, Arterial: 112 mmHg — ABNORMAL HIGH (ref 83.0–108.0)
pO2, Arterial: 74 mmHg — ABNORMAL LOW (ref 83.0–108.0)
pO2, Arterial: 78 mmHg — ABNORMAL LOW (ref 83.0–108.0)
pO2, Arterial: 73 mmHg — ABNORMAL LOW (ref 83.0–108.0)

## 2021-04-20 LAB — URINALYSIS, COMPLETE (UACMP) WITH MICROSCOPIC
Bilirubin Urine: NEGATIVE
Glucose, UA: NEGATIVE mg/dL
Ketones, ur: NEGATIVE mg/dL
Nitrite: NEGATIVE
Protein, ur: 100 mg/dL — AB
Specific Gravity, Urine: 1.021 (ref 1.005–1.030)
WBC, UA: >50 WBC/hpf — ABNORMAL HIGH (ref 0–5)
pH: 8 (ref 5.0–8.0)

## 2021-04-20 LAB — LACTIC ACID, PLASMA: Lactic Acid, Venous: 1.5 mmol/L (ref 0.5–1.9)

## 2021-04-20 LAB — COMPREHENSIVE METABOLIC PANEL
ALT: 12 U/L (ref 0–44)
AST: 19 U/L (ref 15–41)
Albumin: 3.8 g/dL (ref 3.5–5.0)
Alkaline Phosphatase: 51 U/L (ref 38–126)
Anion gap: 9 (ref 5–15)
BUN: 30 mg/dL — ABNORMAL HIGH (ref 8–23)
CO2: 34 mmol/L — ABNORMAL HIGH (ref 22–32)
Calcium: 9.2 mg/dL (ref 8.9–10.3)
Chloride: 97 mmol/L — ABNORMAL LOW (ref 98–111)
Creatinine, Ser: 1.34 mg/dL — ABNORMAL HIGH (ref 0.44–1.00)
GFR, Estimated: 39 mL/min — ABNORMAL LOW (ref >60)
Glucose, Bld: 179 mg/dL — ABNORMAL HIGH (ref 70–99)
Potassium: 4.7 mmol/L (ref 3.5–5.1)
Sodium: 140 mmol/L (ref 135–145)
Total Bilirubin: 0.7 mg/dL (ref 0.3–1.2)
Total Protein: 7.6 g/dL (ref 6.5–8.1)

## 2021-04-20 LAB — CBC
HCT: 45.4 % (ref 36.0–46.0)
Hemoglobin: 13.5 g/dL (ref 12.0–15.0)
MCH: 31.7 pg (ref 26.0–34.0)
MCHC: 29.7 g/dL — ABNORMAL LOW (ref 30.0–36.0)
MCV: 106.6 fL — ABNORMAL HIGH (ref 80.0–100.0)
Platelets: 258 10*3/uL (ref 150–400)
RBC: 4.26 MIL/uL (ref 3.87–5.11)
RDW: 14.6 % (ref 11.5–15.5)
WBC: 7.4 10*3/uL (ref 4.0–10.5)
nRBC: 0.9 % — ABNORMAL HIGH (ref 0.0–0.2)

## 2021-04-20 LAB — HEMOGLOBIN A1C
Hgb A1c MFr Bld: 7 % — ABNORMAL HIGH (ref 4.8–5.6)
Mean Plasma Glucose: 154.2 mg/dL

## 2021-04-20 LAB — PROTIME-INR
INR: 1.2 (ref 0.8–1.2)
Prothrombin Time: 15.4 seconds — ABNORMAL HIGH (ref 11.4–15.2)

## 2021-04-20 LAB — APTT: aPTT: 26 seconds (ref 24–36)

## 2021-04-20 LAB — CBG MONITORING, ED
Glucose-Capillary: 149 mg/dL — ABNORMAL HIGH (ref 70–99)
Glucose-Capillary: 110 mg/dL — ABNORMAL HIGH (ref 70–99)

## 2021-04-20 LAB — RESP PANEL BY RT-PCR (FLU A&B, COVID) ARPGX2
Influenza A by PCR: NEGATIVE
Influenza B by PCR: NEGATIVE
SARS Coronavirus 2 by RT PCR: NEGATIVE

## 2021-04-20 LAB — PROCALCITONIN: Procalcitonin: <0.1 ng/mL

## 2021-04-20 LAB — BRAIN NATRIURETIC PEPTIDE: B Natriuretic Peptide: 454.3 pg/mL — ABNORMAL HIGH (ref 0.0–100.0)

## 2021-04-20 MED ORDER — INSULIN ASPART 100 UNIT/ML IJ SOLN
0.0000 [IU] | INTRAMUSCULAR | Status: DC
  Administered 2021-04-20 – 2021-04-21 (×2): 2 [IU] via SUBCUTANEOUS
  Administered 2021-04-22: 3 [IU] via SUBCUTANEOUS
  Administered 2021-04-22: 5 [IU] via SUBCUTANEOUS
  Administered 2021-04-23: 3 [IU] via SUBCUTANEOUS
  Administered 2021-04-23: 5 [IU] via SUBCUTANEOUS
  Administered 2021-04-23 – 2021-04-24 (×5): 3 [IU] via SUBCUTANEOUS
  Administered 2021-04-25: 09:00:00 2 [IU] via SUBCUTANEOUS
  Administered 2021-04-25: 17:00:00 3 [IU] via SUBCUTANEOUS
  Administered 2021-04-25: 13:00:00 8 [IU] via SUBCUTANEOUS
  Administered 2021-04-25: 23:00:00 3 [IU] via SUBCUTANEOUS
  Administered 2021-04-26: 2 [IU] via SUBCUTANEOUS
  Filled 2021-04-20 (×14): qty 1

## 2021-04-20 MED ORDER — ONDANSETRON HCL 4 MG PO TABS: 4.0000 mg | ORAL_TABLET | Freq: Four times a day (QID) | ORAL | Status: DC | PRN

## 2021-04-20 MED ORDER — SODIUM CHLORIDE 0.9% FLUSH
3.0000 mL | Freq: Once | INTRAVENOUS | Status: AC
  Administered 2021-04-20: 3 mL via INTRAVENOUS

## 2021-04-20 MED ORDER — SODIUM CHLORIDE 0.9 % IV SOLN
1.0000 g | INTRAVENOUS | Status: DC
  Administered 2021-04-20 – 2021-04-21 (×2): 1 g via INTRAVENOUS
  Filled 2021-04-20 (×4): qty 10

## 2021-04-20 MED ORDER — ONDANSETRON HCL 4 MG/2ML IJ SOLN
4.0000 mg | Freq: Once | INTRAMUSCULAR | Status: AC
  Administered 2021-04-20: 4 mg via INTRAVENOUS
  Filled 2021-04-20: qty 2

## 2021-04-20 MED ORDER — SODIUM CHLORIDE 0.9 % IV SOLN
250.0000 mL | INTRAVENOUS | Status: DC | PRN
  Administered 2021-04-21: 250 mL via INTRAVENOUS

## 2021-04-20 MED ORDER — FUROSEMIDE 10 MG/ML IJ SOLN
40.0000 mg | Freq: Every day | INTRAMUSCULAR | Status: DC
  Administered 2021-04-20 – 2021-04-21 (×2): 40 mg via INTRAVENOUS
  Filled 2021-04-20 (×2): qty 4

## 2021-04-20 MED ORDER — SODIUM CHLORIDE 0.9% FLUSH: 3.0000 mL | INTRAVENOUS | Status: DC | PRN

## 2021-04-20 MED ORDER — TIMOLOL MALEATE 0.5 % OP SOLN
1.0000 [drp] | Freq: Two times a day (BID) | OPHTHALMIC | Status: DC
  Administered 2021-04-23 – 2021-04-26 (×7): 1 [drp] via OPHTHALMIC
  Filled 2021-04-20 (×3): qty 5

## 2021-04-20 MED ORDER — ACETAMINOPHEN 325 MG PO TABS
650.0000 mg | ORAL_TABLET | Freq: Four times a day (QID) | ORAL | Status: DC | PRN
  Administered 2021-04-23: 650 mg via ORAL
  Filled 2021-04-20: qty 2

## 2021-04-20 MED ORDER — BRIMONIDINE TARTRATE 0.2 % OP SOLN
1.0000 [drp] | Freq: Two times a day (BID) | OPHTHALMIC | Status: DC
  Administered 2021-04-23 – 2021-04-26 (×7): 1 [drp] via OPHTHALMIC
  Filled 2021-04-20 (×3): qty 5

## 2021-04-20 MED ORDER — PANTOPRAZOLE SODIUM 40 MG IV SOLR
40.0000 mg | Freq: Two times a day (BID) | INTRAVENOUS | Status: DC
  Filled 2021-04-20: qty 40

## 2021-04-20 MED ORDER — ACETAMINOPHEN 650 MG RE SUPP
650.0000 mg | Freq: Four times a day (QID) | RECTAL | Status: DC | PRN
  Filled 2021-04-20: qty 1

## 2021-04-20 MED ORDER — LATANOPROST 0.005 % OP SOLN
1.0000 [drp] | Freq: Every day | OPHTHALMIC | Status: DC
  Administered 2021-04-23 – 2021-04-25 (×3): 1 [drp] via OPHTHALMIC
  Filled 2021-04-20 (×3): qty 2.5

## 2021-04-20 MED ORDER — PANTOPRAZOLE 80MG IVPB - SIMPLE MED
80.0000 mg | Freq: Once | INTRAVENOUS | Status: AC
  Administered 2021-04-20: 80 mg via INTRAVENOUS
  Filled 2021-04-20: qty 80

## 2021-04-20 MED ORDER — ONDANSETRON HCL 4 MG/2ML IJ SOLN: 4.0000 mg | Freq: Four times a day (QID) | INTRAMUSCULAR | Status: DC | PRN

## 2021-04-20 MED ORDER — BRIMONIDINE TARTRATE-TIMOLOL 0.2-0.5 % OP SOLN
1.0000 [drp] | Freq: Two times a day (BID) | OPHTHALMIC | Status: DC
  Filled 2021-04-20: qty 5

## 2021-04-20 MED ORDER — SODIUM CHLORIDE 0.9% FLUSH
3.0000 mL | Freq: Two times a day (BID) | INTRAVENOUS | Status: DC
  Administered 2021-04-20 – 2021-04-26 (×4): 3 mL via INTRAVENOUS

## 2021-04-20 MED ORDER — PANTOPRAZOLE INFUSION (NEW) - SIMPLE MED
8.0000 mg/h | INTRAVENOUS | Status: DC
  Administered 2021-04-20 – 2021-04-21 (×2): 8 mg/h via INTRAVENOUS
  Filled 2021-04-20: qty 80
  Filled 2021-04-20 (×2): qty 100

## 2021-04-20 NOTE — ED Notes (Signed)
Safety mitts placed on patient and MD made aware of patient continuing to pull at her lines and bi pap mask.

## 2021-04-20 NOTE — ED Triage Notes (Signed)
Pt comes into the ED via EMS from home with c/o AMS today, per pt daughter, pt is normally a/ox4, pt is blind, states she is completely altered today, states she is talking out of her head, pt c/o abd pain with nausea

## 2021-04-20 NOTE — ED Notes (Signed)
Pt removed bi pap mask and all vital sign monitoring devices.  O2 65%.

## 2021-04-20 NOTE — ED Notes (Signed)
Pt more alert, asking for water. Mouth swabs and lip moisturizer given and applied. Pt states she is more comfortable, continuing to monitor.

## 2021-04-20 NOTE — ED Provider Notes (Signed)
[facility] Emergency Department Provider Note ____________________________________________   None    (approximate)  I have reviewed the triage vital signs and the nursing notes.  HISTORY  Chief Complaint Altered Mental Status   HPI Jessica Nash is a 87 y.o. femalewho presents to the ED for evaluation of altered mentation.   Chart review indicates CAD on DAPT.  Blind patient.  Chronic respiratory failure 2 L O2 at baseline.  Daughter is patient to the ED for the ration of altered mentation, confusion and delirium.  Patient is unable to provide any relevant history due to her disorientation and confusion.  Daughter Ezekiel Slocumb), at the bedside, provides all history.  She reports that she recently moved the patient from [place] to her home here locally.  They have been living together here for the past week or so.  Patient has been doing well until this morning, when she has been confused since awakening.  Daughter reports that she seemed fine was normal last night and that for the past week.  No known falls, injuries or incidents.  Daughter reports finding her turned the wrong way in bed today and she had ripped off her oxygen cannula.  She was "talking out of her head" and not making any sense. Daughter does report that for the past 3 days she has had frequent melanotic bowel movements.  Complaining intermittently of abdominal discomfort.  Daughter reports that patient has no signed advanced directives, but would not want intubation or ventilator management, would not want CPR.  She would be agreeable to blood transfusions, but otherwise DNR/DNI.  Past Medical History:  Diagnosis Date   Blind 2011   Breast cancer [facility])    Breast cancer metastasized to axillary lymph node ([place]) 4/12   Papillary carcinoma. 2/3 sentinel nodes positive. Dr Bary Castilla   Breast cyst    CAD (coronary artery disease)    Cellulitis and abscess of trunk 2013   Degenerative disc  disease    Diabetes mellitus    Diverticulitis    GERD (gastroesophageal reflux disease)    Glaucoma 2010   blindness d/t glaucoma   Hx of colonic polyps    Hx of decompressive lumbar laminectomy    3 Level lumbar decompressive laminotomies with  foraminotomies 02/06   Hyperlipidemia    Hypertension    Myocardial infarction [facility]) 2010   Personal history of tobacco use, presenting hazards to health     Patient Active Problem List   Diagnosis Date Noted   Acute on chronic respiratory failure ([place]) 04/20/2021   Blind    Myalgia 06/19/2012   Blindness of both eyes 06/08/2011   Breast cancer ([place])    CONSTIPATION, CHRONIC 06/10/2010   DECREASED HEARING 08/16/2009   CORONARY ARTERY DISEASE 07/07/2008   OSTEOARTHRITIS 06/12/2008   HYPERLIPIDEMIA 02/15/2007   TUBULOVILLOUS ADENOMA, 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   CORONARY ANGIOPLASTY WITH STENT PLACEMENT  2010   done  by Annia Belt MD   EYE SURGERY     GLAUCOMA SURGERY  01/03-03/03   MASTECTOMY  4/12   Dr Kris Mouton RT after   MASTECTOMY Right 2012    Prior to Admission medications   Medication Sig Start Date End Date Taking? Authorizing Provider  amLODipine (NORVASC) 5 MG tablet Take 5 mg by mouth daily.   Yes [provider]  anastrozole (ARIMIDEX) 1 MG tablet Take 1 mg by mouth daily.     Yes [provider]  aspirin 81 MG tablet Take 81 mg by mouth daily.     Yes [provider]  atorvastatin (LIPITOR) 40  MG tablet Take 40 mg by mouth at bedtime.   Yes [provider]  brimonidine-timolol (COMBIGAN) 0.2-0.5 % ophthalmic solution Place 1 drop into both eyes every 12 (twelve) hours.     Yes [provider]  Calcium Carbonate-Vitamin D (CALCIUM-VITAMIN D) 500-200 MG-UNIT per tablet Take 1 tablet by mouth 2 (two) times daily with a meal.     Yes [provider]  cholecalciferol (VITAMIN D3) 25 MCG (1000 UNIT) tablet Take 1,000 Units by mouth daily.   Yes [provider]  famotidine (PEPCID) 40 MG tablet Take 40 mg by mouth daily.   Yes [provider]  gabapentin (NEURONTIN) 300 MG capsule Take 300 mg by mouth at bedtime.   Yes [provider]  glipiZIDE (GLUCOTROL) 5 MG tablet TAKE 1 TABLET BY MOUTH DAILY 07/11/12  Yes Viviana Simpler I, MD  isosorbide dinitrate (ISORDIL) 30 MG tablet Take 30 mg by mouth daily.   Yes [provider]  latanoprost (XALATAN) 0.005 % ophthalmic solution 1 drop at bedtime.   Yes [provider]  metoprolol (LOPRESSOR) 100 MG tablet TAKE 1 TABLET BY MOUTH TWICE A DAY 06/25/12  Yes Venia Carbon, MD  traMADol (ULTRAM) 50 MG tablet Take 25 mg by mouth every 6 (six) hours as needed.   Yes [provider]  amLODipine-benazepril (LOTREL) 10-20 MG per capsule Take 1 capsule by mouth daily. Patient not taking: Reported on 04/20/2021 08/19/12   Venia Carbon, MD  atorvastatin (LIPITOR) 10 MG tablet Take 1 tablet (10 mg total) by mouth daily. 05/21/12 05/21/13  Viviana Simpler I, MD  clopidogrel (PLAVIX) 75 MG tablet TAKE 1 TABLET EVERY DAY Patient not taking: Reported on 04/20/2021 03/04/12   Venia Carbon, MD  dorzolamide (TRUSOPT) 2 % ophthalmic solution Place 1 drop into both eyes 2 (two) times daily.   Patient not taking: Reported on 04/20/2021    [provider]  HYDROcodone-acetaminophen (NORCO/VICODIN) 5-325 MG per tablet TAKE 1 TABLET BY MOUTH 4 TIMES A DAY AS NEEDED Patient  not taking: Reported on 04/20/2021 08/19/12   Venia Carbon, MD  polyethylene glycol (MIRALAX / GLYCOLAX) packet Take 17 g by mouth daily. Patient not taking: Reported on 04/20/2021    [provider]  ranitidine (ZANTAC) 300 MG tablet Take 1 tablet (300 mg total) by mouth 2 (two) times daily. Patient not taking: Reported on 04/20/2021 12/05/11   Venia Carbon, MD  sennosides-docusate sodium (SENOKOT-S) 8.6-50 MG tablet Take 2 tablets by mouth daily.  Patient not taking: Reported on 04/20/2021    [provider]  travoprost, benzalkonium, (TRAVATAN) 0.004 % ophthalmic solution Place 1 drop into both eyes 2 (two) times daily.   Patient not taking: Reported on 04/20/2021    [provider]    Allergies Patient has no known allergies.  Family History  Problem Relation Age of Onset   Cancer Daughter        colon cancer   Cancer Son        colon cancer   Cancer Other        unknoen family members with ovarian and breast cancers    Social History Social History   Tobacco Use   Smoking status: Former    Years: 20.00    Types: Cigarettes    Quit date: 05/30/2007    Years since quitting: 13.9   Smokeless tobacco: Never  Substance Use Topics   Alcohol use: No   Drug use: No    Review of Systems  Unable to be accurately obtained due to patient's disorientation ____________________________________________   PHYSICAL EXAM:  VITAL SIGNS: Vitals:   04/20/21 1208 04/20/21 1300  BP: 134/67 132/66  Pulse: (!) 103 94  Resp: (!) 26 (!) 25  Temp:    SpO2: 97% 95%      Constitutional: Somnolent and tachypneic.  Follows simple commands in all 4 extremities. Eyes: Conjunctivae are normal. PERRL. EOMI. Head: Atraumatic. Nose: No congestion/rhinnorhea. Mouth/Throat: Mucous membranes are moist.  Oropharynx non-erythematous. Neck: No stridor. No cervical spine tenderness to palpation. Cardiovascular: Tachycardic rate, regular rhythm. Grossly normal  heart sounds.  Good peripheral circulation. Respiratory: Tachypneic to nearly 30.  Bibasilar crackles are present.  Some expiratory wheezes are present. Gastrointestinal: Soft , nondistended, Musculoskeletal: No lower extremity tenderness nor edema.  No joint effusions. No signs of acute trauma. Neurologic:   No gross focal neurologic deficits are appreciated beyond her known and chronic blindness. Skin:  Skin is warm, dry and intact. No rash noted. Psychiatric: Mood and affect are normal. Speech and behavior are normal.  ____________________________________________   LABS (all labs ordered are listed, but only abnormal results are displayed)  Labs Reviewed  COMPREHENSIVE METABOLIC PANEL - Abnormal; Notable for the following components:      Result Value   Chloride 97 (*)    CO2 34 (*)    Glucose, Bld 179 (*)    BUN 30 (*)    Creatinine, Ser 1.34 (*)    GFR, Estimated 39 (*)    All other components within normal limits  CBC - Abnormal; Notable for the following components:   MCV 106.6 (*)    MCHC 29.7 (*)    nRBC 0.9 (*)    All other components within normal limits  PROTIME-INR - Abnormal; Notable for the following components:   Prothrombin Time 15.4 (*)    All other components within normal limits  BLOOD GAS, ARTERIAL - Abnormal; Notable for the following components:   pH, Arterial 7.29 (*)    pCO2 arterial 85 (*)    pO2, Arterial 112 (*)    Bicarbonate 40.9 (*)    Acid-Base Excess 10.6 (*)    All other components within normal limits  BRAIN NATRIURETIC PEPTIDE - Abnormal; Notable for the following components:   B Natriuretic Peptide 454.3 (*)    All other components within normal limits  BLOOD GAS, ARTERIAL - Abnormal; Notable for the following components:   pH, Arterial 7.26 (*)    pCO2 arterial 93 (*)    pO2, Arterial 74 (*)    Bicarbonate 41.7 (*)    Acid-Base Excess 10.7 (*)    All other components within normal limits  CULTURE, BLOOD (SINGLE)  APTT   PROCALCITONIN  LACTIC ACID, PLASMA  CBG MONITORING, ED  TYPE AND SCREEN   ____________________________________________  12 Lead EKG  Sinus tachycardia with a rate of 106 bpm.  Normal axis.  Right bundle.  No STEMI. ____________________________________________  RADIOLOGY  ED MD interpretation: 1 view CXR reviewed by me with diffuse interstitial opacities concerning for pulmonary edema in setting of small bilateral pleural effusions.  Official radiology report(s): DG Chest Portable 1 View  Result Date: 04/20/2021 CLINICAL DATA:  Hypoxia. Abdominal pain. Nausea. Evaluate infiltrate. EXAM: PORTABLE CHEST 1 VIEW COMPARISON:  01/15/2009 FINDINGS: Midline trachea. Cardiomegaly accentuated by AP portable technique. Probable small bilateral pleural effusions. No pneumothorax. Right greater than left lower lung predominant interstitial and airspace disease. IMPRESSION: Right greater than left interstitial and airspace disease is lower lung predominant. Pulmonary edema and/or infection. Probable small bilateral pleural effusions. Aortic Atherosclerosis (ICD10-I70.0). Electronically Signed   By: Abigail Miyamoto M.D.   On: 04/20/2021 11:32    ____________________________________________   PROCEDURES and INTERVENTIONS  Procedure(s) performed (including Critical Care):  .1-3 Lead EKG Interpretation Performed by: Vladimir Crofts, MD Authorized by: Vladimir Crofts, MD     Interpretation: abnormal     ECG rate:  112   ECG rate assessment: tachycardic     Rhythm: sinus tachycardia     Ectopy: none     Conduction: normal   .Critical Care Performed by: Vladimir Crofts, MD Authorized by: Vladimir Crofts, MD   Critical care provider statement:    Critical care time (minutes):  30   Critical care time was exclusive of:  Separately billable procedures and treating other patients   Critical care was necessary to treat or prevent imminent or life-threatening deterioration of the following conditions:   Respiratory failure   Critical care was time spent personally by me on the following activities:  Development of treatment plan with patient or surrogate, discussions with consultants, evaluation of patient's response to treatment, examination of patient, ordering and review of laboratory studies, ordering and review of radiographic studies, ordering and performing treatments and interventions, pulse oximetry, re-evaluation of patient's condition and review of old charts  Medications  pantoprozole (PROTONIX) 80 mg /NS 100 mL infusion (8 mg/hr Intravenous New Bag/Given 04/20/21 1308)  pantoprazole (PROTONIX) injection 40 mg (has no administration in time range)  sodium chloride flush (NS) 0.9 % injection 3 mL (3 mLs Intravenous Given 04/20/21 1206)  pantoprazole (PROTONIX) 80 mg /NS 100 mL IVPB (0 mg Intravenous Stopped 04/20/21 1307)  ondansetron (ZOFRAN) injection 4 mg (4 mg Intravenous Given 04/20/21 1207)    ____________________________________________   MDM / ED COURSE   87 year old woman presents to the ED confused with her daughter, likely metabolic encephalopathy in the setting of hypercapnic and hypoxic respiratory failure.  She is DNR/DNI, and placed on BiPAP due to respiratory acidosis.  Unfortunately, her respiratory acidosis seems to be worsening despite BiPAP therapy.  Daughter is emphatic that she would not want endotracheal intubation, which I think is reasonable.  Suspect new onset CHF contributing to hypoxia and causing CXR infiltrates.  No evidence of pneumonia considering lack of leukocytosis and negative procalcitonin.  Daughter would like to pursue medical admission to hopefully correct this acute illness but wishes to uphold DNR/DNI status  Clinical Course as of 04/20/21 1529  Wed Apr 20, 2021  1200 Reassessed.  Daughter had stepped out.  ABG noted and respiratory is setting of BiPAP. [DS]  [id number].  Have been trying to coordinate with nursing and RT to go over to  CT for CT head prior to admission.  I reassessed the patient and notes a good waveform a sats of  87% while she is on the BiPAP.  Only pulling 100-120 cc of volume.  I increase expiratory pressure such that she is now 18/8, arouse via sternal rub and she awakens, localizes bilaterally and not taking larger breaths 350 cc. [DS]  1441 ABG worsening.  Awaiting CT. [DS]  1030 Reassessed.  Had another conversation with daughter at the bedside and clarified goals of care.  We discussed worsening ABG and that I would often intubate these patients to breathe for them in the situation, but daughter is emphatic that patient would not want that and confirms DNR/DNI status. [DS]    Clinical Course User Index [DS] Vladimir Crofts, MD    ____________________________________________   FINAL CLINICAL IMPRESSION(S) / ED DIAGNOSES  Final diagnoses:  Acute respiratory failure with hypoxia and hypercapnia [facility])     ED Discharge Orders     None        Shantanu Strauch   Note:  This document was prepared using Dragon voice recognition software and may include unintentional dictation errors.    Vladimir Crofts, MD 04/20/21 [id number]

## 2021-04-20 NOTE — H&P (Addendum)
History and Physical    Jessica Nash KCM:[id number] DOB: 1933-02-04 DOA: 04/20/2021  PCP: Pcp, No   Patient coming from: Home  I have personally briefly reviewed patient's old medical records in [facility]  Chief Complaint: Change in mental status   Most of the history was obtained from her daughter at the bedside.  Patient is unable to provide any history due to her mental status  HPI: Jessica Nash is a 87 y.o. female with medical history significant for chronic respiratory failure on 2 L of oxygen, coronary artery disease on dual antiplatelet therapy, legally blind who was brought into the ER by EMS for evaluation of change in mental status. Her daughter states that 3 days ago patient was noted to have loose, dark watery stools with complaints of abdominal discomfort.  She also states that the patient had foul-smelling urine.   She states that patient only takes prescribed medications and denies any NSAID use. On the morning of her admission patient's daughter states that she found the patient lying across her bed poorly responsive and off her oxygen.  This concerned her so she called EMS and patient was brought to the emergency room. At baseline according to her daughter she is usually oriented to person, place and time and is able to carry out some activities of daily living. She states that patient usually takes tramadol as needed for pain and has not had any new medication changes. I am unable to do a review of systems on this patient due to her mental status changes. ABG 7.29/85/112/40.9/97.9 Sodium 140, potassium 4.7, chloride 97, bicarb 34, glucose 179, BUN 30, creatinine 1.34, calcium 9.2, alkaline phosphatase 51, albumin 3.8, AST 19, ALT 12, total protein 7.6, total bilirubin 0.7, BNP 454, lactic acid 1.5, procalcitonin less than 0.10, white count 7.4, hemoglobin 13.5, hematocrit 45.4, MCV 100.6, RDW 14.6, platelet count 258, PT 15.4, INR 1.2 Twelve-lead EKG reviewed by me  shows sinus tachycardia with right bundle branch block.   ED Course: Patient is an 87 year old female who presents to the ER via EMS for evaluation of mental status changes. She was noted to have hypercapnic respiratory failure in the ER and is currently on a BiPAP. She will be admitted to the hospital for further evaluation.   Review of Systems: As per HPI otherwise all other systems reviewed and negative.    Past Medical History:  Diagnosis Date   Blind 2011   Breast cancer [facility])    Breast cancer metastasized to axillary lymph node ([place]) 4/12   Papillary carcinoma. 2/3 sentinel nodes positive. Dr Bary Castilla   Breast cyst    CAD (coronary artery disease)    Cellulitis and abscess of trunk 2013   Degenerative disc disease    Diabetes mellitus    Diverticulitis    GERD (gastroesophageal reflux disease)    Glaucoma 2010   blindness d/t glaucoma   Hx of colonic polyps    Hx of decompressive lumbar laminectomy    3 Level lumbar decompressive laminotomies with  foraminotomies 02/06   Hyperlipidemia    Hypertension    Myocardial infarction [facility]) 2010   Personal history of tobacco use, presenting hazards to health     Past Surgical History:  Procedure Laterality Date   ABDOMINAL HYSTERECTOMY  1964   partial   ANGIOPLASTY     2/10 Angioplasty/stent-RCA---------Dr Arida   BACK SURGERY     BREAST BIOPSY Left 2012   BREAST SURGERY Right 2011   biopsy   COLON  SURGERY  2006   COLONOSCOPY  2008,2011   Dr. Candace [facility]   CORONARY ANGIOPLASTY WITH STENT PLACEMENT  2010   done by Annia Belt MD   [place] SURGERY  01/03-03/03   MASTECTOMY  4/12   Dr Kris Mouton RT after   MASTECTOMY Right 2012     reports that she quit smoking about 13 years ago. She has never used smokeless tobacco. She reports that she does not drink alcohol and does not use drugs.  No Known Allergies  Family History  Problem Relation Age of Onset   Cancer Daughter        colon cancer    Cancer Son        colon cancer   Cancer Other        unknoen family members with ovarian and breast cancers      Prior to Admission medications   Medication Sig Start Date End Date Taking? Authorizing Provider  amLODipine (NORVASC) 5 MG tablet Take 5 mg by mouth daily.   Yes [provider]  anastrozole (ARIMIDEX) 1 MG tablet Take 1 mg by mouth daily.     Yes [provider]  aspirin 81 MG tablet Take 81 mg by mouth daily.     Yes [provider]  atorvastatin (LIPITOR) 40 MG tablet Take 40 mg by mouth at bedtime.   Yes [provider]  brimonidine-timolol (COMBIGAN) 0.2-0.5 % ophthalmic solution Place 1 drop into both eyes every 12 (twelve) hours.     Yes [provider]  Calcium Carbonate-Vitamin D (CALCIUM-VITAMIN D) 500-200 MG-UNIT per tablet Take 1 tablet by mouth 2 (two) times daily with a meal.     Yes [provider]  cholecalciferol (VITAMIN D3) 25 MCG (1000 UNIT) tablet Take 1,000 Units by mouth daily.   Yes [provider]  famotidine (PEPCID) 40 MG tablet Take 40 mg by mouth daily.   Yes [provider]  gabapentin (NEURONTIN) 300 MG capsule Take 300 mg by mouth at bedtime.   Yes [provider]  glipiZIDE (GLUCOTROL) 5 MG tablet TAKE 1 TABLET BY MOUTH DAILY 07/11/12  Yes Viviana Simpler I, MD  isosorbide dinitrate (ISORDIL) 30 MG tablet Take 30 mg by mouth daily.   Yes [provider]  latanoprost (XALATAN) 0.005 % ophthalmic solution 1 drop at bedtime.   Yes [provider]  metoprolol (LOPRESSOR) 100 MG tablet TAKE 1 TABLET BY MOUTH TWICE A DAY 06/25/12  Yes Venia Carbon, MD  traMADol (ULTRAM) 50 MG tablet Take 25 mg by mouth every 6 (six) hours as needed.   Yes [provider]  amLODipine-benazepril (LOTREL) 10-20 MG per capsule Take 1 capsule by mouth daily. Patient not taking: Reported on 04/20/2021 08/19/12   Venia Carbon, MD  atorvastatin (LIPITOR) 10 MG  tablet Take 1 tablet (10 mg total) by mouth daily. 05/21/12 05/21/13  Viviana Simpler I, MD  clopidogrel (PLAVIX) 75 MG tablet TAKE 1 TABLET EVERY DAY Patient not taking: Reported on 04/20/2021 03/04/12   Venia Carbon, MD  dorzolamide (TRUSOPT) 2 % ophthalmic solution Place 1 drop into both eyes 2 (two) times daily.   Patient not taking: Reported on 04/20/2021    [provider]  HYDROcodone-acetaminophen (NORCO/VICODIN) 5-325 MG per tablet TAKE 1 TABLET BY MOUTH 4 TIMES A DAY AS NEEDED Patient not taking: Reported on 04/20/2021 08/19/12   Venia Carbon, MD  polyethylene glycol (MIRALAX / Floria Raveling) packet Take 17  g by mouth daily. Patient not taking: Reported on 04/20/2021    [provider]  ranitidine (ZANTAC) 300 MG tablet Take 1 tablet (300 mg total) by mouth 2 (two) times daily. Patient not taking: Reported on 04/20/2021 12/05/11   Venia Carbon, MD  sennosides-docusate sodium (SENOKOT-S) 8.6-50 MG tablet Take 2 tablets by mouth daily.  Patient not taking: Reported on 04/20/2021    [provider]  travoprost, benzalkonium, (TRAVATAN) 0.004 % ophthalmic solution Place 1 drop into both eyes 2 (two) times daily.   Patient not taking: Reported on 04/20/2021    [provider]    Physical Exam: Vitals:   04/20/21 1048 04/20/21 1050 04/20/21 1208 04/20/21 1300  BP:   134/67 132/66  Pulse:   (!) 103 94  Resp:   (!) 26 (!) 25  Temp: 98.9 F (37.2 C)     TempSrc: Oral     SpO2:  94% 97% 95%     Vitals:   04/20/21 1048 04/20/21 1050 04/20/21 1208 04/20/21 1300  BP:   134/67 132/66  Pulse:   (!) 103 94  Resp:   (!) 26 (!) 25  Temp: 98.9 F (37.2 C)     TempSrc: Oral     SpO2:  94% 97% 95%      Constitutional: Lethargic.  Moans to deep sternal rub.  On BiPAP.  Appears comfortable. HEENT:      Head: Normocephalic and atraumatic.         Eyes: PERLA, EOMI, Conjunctivae are normal. Sclera is non-icteric.       Mouth/Throat: Mucous  membranes are moist.       Neck: Supple with no signs of meningismus. Cardiovascular: Regular rate and rhythm No murmurs, gallops, or rubs. 2+ symmetrical distal pulses are present . No JVD. No LE edema Respiratory: Respiratory effort normal .Lungs sounds clear bilaterally. Faint wheezes and crackles.  Gastrointestinal: Soft, non tender, and non distended with positive bowel sounds.  Genitourinary: No CVA tenderness. Musculoskeletal: Nontender with normal range of motion in all extremities. No cyanosis, or erythema of extremities. Neurologic: Unable to assess. Skin: Skin is warm, dry.  No rash or ulcers Psychiatric: Unable to assess   Labs on Admission: I have personally reviewed following labs and imaging studies  CBC: Recent Labs  Lab 04/20/21 1155  WBC 7.4  HGB 13.5  HCT 45.4  MCV 106.6*  PLT 867   Basic Metabolic Panel: Recent Labs  Lab 04/20/21 1155  NA 140  K 4.7  CL 97*  CO2 34*  GLUCOSE 179*  BUN 30*  CREATININE 1.34*  CALCIUM 9.2   GFR: CrCl cannot be calculated (Unknown ideal weight.). Liver Function Tests: Recent Labs  Lab 04/20/21 1155  AST 19  ALT 12  ALKPHOS 51  BILITOT 0.7  PROT 7.6  ALBUMIN 3.8   No results for input(s): LIPASE, AMYLASE in the last 168 hours. No results for input(s): AMMONIA in the last 168 hours. Coagulation Profile: Recent Labs  Lab 04/20/21 1155  INR 1.2   Cardiac Enzymes: No results for input(s): CKTOTAL, CKMB, CKMBINDEX, TROPONINI in the last 168 hours. BNP (last 3 results) No results for input(s): PROBNP in the last 8760 hours. HbA1C: No results for input(s): HGBA1C in the last 72 hours. CBG: No results for input(s): GLUCAP in the last 168 hours. Lipid Profile: No results for input(s): CHOL, HDL, LDLCALC, TRIG, CHOLHDL, LDLDIRECT in the last 72 hours. Thyroid Function Tests: No results for input(s): TSH, T4TOTAL, FREET4, T3FREE,  THYROIDAB in the last 72 hours. Anemia Panel: No results for input(s):  VITAMINB12, FOLATE, FERRITIN, TIBC, IRON, RETICCTPCT in the last 72 hours. Urine analysis:    Component Value Date/Time   COLORURINE AMBER (A) 04/20/2021 1453   APPEARANCEUR CLOUDY (A) 04/20/2021 1453   LABSPEC 1.021 04/20/2021 1453   PHURINE 8.0 04/20/2021 1453   GLUCOSEU NEGATIVE 04/20/2021 1453   HGBUR SMALL (A) 04/20/2021 1453   HGBUR large 07/14/2008 0928   BILIRUBINUR NEGATIVE 04/20/2021 1453   KETONESUR NEGATIVE 04/20/2021 1453   PROTEINUR 100 (A) 04/20/2021 1453   UROBILINOGEN 0.2 07/14/2008 0928   NITRITE NEGATIVE 04/20/2021 1453   LEUKOCYTESUR LARGE (A) 04/20/2021 1453    Radiological Exams on Admission: CT HEAD WO CONTRAST (5MM)  Result Date: 04/20/2021 CLINICAL DATA:  Mental status change, unknown cause. EXAM: CT HEAD WITHOUT CONTRAST TECHNIQUE: Contiguous axial images were obtained from the base of the skull through the vertex without intravenous contrast. COMPARISON:  Head CT 07/04/2008. FINDINGS: Brain: Cerebral volume is unremarkable for age. Mild patchy and ill-defined hypoattenuation within the cerebral white matter, nonspecific but compatible with chronic small vessel ischemic disease. There is no acute intracranial hemorrhage. No demarcated cortical infarct. No extra-axial fluid collection. No evidence of an intracranial mass. No midline shift. Vascular: No hyperdense vessel.  Atherosclerotic calcifications. Skull: Normal. Negative for fracture or focal lesion. Sinuses/Orbits: Visualized orbits show no acute finding. Left phthisis bulbi. Mild mucosal thickening within the left frontal sinus. Mild mucosal thickening versus fluid within a posterior left ethmoid air cell. IMPRESSION: No evidence of acute intracranial abnormality. Mild chronic small vessel ischemic changes within the cerebral white matter, progressed from the head CT of 07/04/2008. Paranasal sinus disease at the imaged levels, as described. Left phthisis bulbi. Electronically Signed   By: Kellie Simmering D.O.    On: 04/20/2021 15:00   DG Chest Portable 1 View  Result Date: 04/20/2021 CLINICAL DATA:  Hypoxia. Abdominal pain. Nausea. Evaluate infiltrate. EXAM: PORTABLE CHEST 1 VIEW COMPARISON:  01/15/2009 FINDINGS: Midline trachea. Cardiomegaly accentuated by AP portable technique. Probable small bilateral pleural effusions. No pneumothorax. Right greater than left lower lung predominant interstitial and airspace disease. IMPRESSION: Right greater than left interstitial and airspace disease is lower lung predominant. Pulmonary edema and/or infection. Probable small bilateral pleural effusions. Aortic Atherosclerosis (ICD10-I70.0). Electronically Signed   By: Abigail Miyamoto M.D.   On: 04/20/2021 11:32     Assessment/Plan Principal Problem:   Acute on chronic respiratory failure (HCC) Active Problems:   Hypertension   CAD (coronary artery disease)   Acute lower UTI   Breast cancer ([place])   Myocardial infarction ([place])   Hypercapnic respiratory failure [facility])       Patient is an 87 year old female who presents to the ER for evaluation of change in mental status.   Acute on chronic hypercapnic respiratory failure Patient has a history of chronic respiratory failure and is on 2 L of oxygen She presents for evaluation of mental status changes and arterial blood gas showed uncompensated respiratory acidosis Continue BiPAP Repeat arterial blood gas in 2 hours Keep patient n.p.o.    Acute metabolic encephalopathy Secondary to hypercapnic respiratory failure as well as urinary tract infection Keep patient n.p.o. Expect improvement in patient's mental status following resolution of hypercarbia      CHF Unclear etiology Chest x-ray shows findings suggestive of pulmonary edema with bilateral pleural effusions BNP is elevated Place patient on Lasix 40 mg IV daily Obtain 2D echocardiogram to assess LVEF    Diabetes  mellitus Hold oral hypoglycemic agent Check blood sugars every 4  hours    History of breast cancer Hold Arimidex until patient's mental status improves    ??  Rectal bleed Patient's daughter states that she had multiple episodes of dark stools H&H is stable Follow-up results of repeat H&H in a.m. Will hold off on administering anticoagulation    UTI Patient has pyuria We will treat patient empirically with Rocephin 1g IV daily until urine culture results become available Follow-up results of urine culture  DVT prophylaxis: SCD  Code Status: DO NOT RESUSCITATE Family Communication: Greater than 50% of time was spent discussing patient's condition and plan of care with her daughter Andris Flurry at the bedside.  All questions and concerns have been addressed.  CODE STATUS was discussed and patient is a DNR. Disposition Plan: Back to previous home environment Consults called: none  Status:At the time of admission, it appears that the appropriate admission status for this patient is inpatient. This is judged to be reasonable and necessary to provide the required intensity of service to ensure the patient's safety given the presenting symptoms, physical exam findings, and initial radiographic and laboratory data in the context of their comorbid conditions. Patient requires inpatient status due to high intensity of service, high risk for further deterioration and high frequency of surveillance required.     Collier Bullock MD [facility]     04/20/2021, 4:35 PM

## 2021-04-20 NOTE — ED Notes (Signed)
Pt has been getting agitated, pulled off BiPap mask while family at bedside. This RN placed mask back on, but pt remained agitated and confused, continued pulling on mask and other lines. RT consulted to see if appropriate to trial off BiPap, and we are now taking off mask to trial on [facility]. Pt tolerating well, less anxious, and sats maintaining between 92-93%. Will notify MD now

## 2021-04-20 NOTE — ED Notes (Signed)
Pt removed Bi pap mask and vital sign monitoring devices. All monitoring devices placed back onto patient and bi pap was also placed back onto patients face.

## 2021-04-21 ENCOUNTER — Inpatient Hospital Stay (HOSPITAL_COMMUNITY)
Admit: 2021-04-21 | Discharge: 2021-04-21 | Disposition: A | Discharge status: Home / Self Care | Payer: Medicare Other | Attending: Internal Medicine | Admitting: Internal Medicine

## 2021-04-21 DIAGNOSIS — J9602 Acute respiratory failure with hypercapnia: Secondary | ICD-10-CM | POA: Diagnosis not present

## 2021-04-21 DIAGNOSIS — J9622 Acute and chronic respiratory failure with hypercapnia: Secondary | ICD-10-CM | POA: Diagnosis not present

## 2021-04-21 DIAGNOSIS — J9601 Acute respiratory failure with hypoxia: Secondary | ICD-10-CM | POA: Diagnosis not present

## 2021-04-21 DIAGNOSIS — I5031 Acute diastolic (congestive) heart failure: Secondary | ICD-10-CM | POA: Diagnosis not present

## 2021-04-21 DIAGNOSIS — N390 Urinary tract infection, site not specified: Secondary | ICD-10-CM

## 2021-04-21 LAB — COMPREHENSIVE METABOLIC PANEL
ALT: 12 U/L (ref 0–44)
AST: 18 U/L (ref 15–41)
Albumin: 3.7 g/dL (ref 3.5–5.0)
Alkaline Phosphatase: 47 U/L (ref 38–126)
Anion gap: 8 (ref 5–15)
BUN: 28 mg/dL — ABNORMAL HIGH (ref 8–23)
CO2: 36 mmol/L — ABNORMAL HIGH (ref 22–32)
Calcium: 9.3 mg/dL (ref 8.9–10.3)
Chloride: 100 mmol/L (ref 98–111)
Creatinine, Ser: 1.38 mg/dL — ABNORMAL HIGH (ref 0.44–1.00)
GFR, Estimated: 37 mL/min — ABNORMAL LOW (ref >60)
Glucose, Bld: 102 mg/dL — ABNORMAL HIGH (ref 70–99)
Potassium: 4.3 mmol/L (ref 3.5–5.1)
Sodium: 144 mmol/L (ref 135–145)
Total Bilirubin: 0.8 mg/dL (ref 0.3–1.2)
Total Protein: 7.5 g/dL (ref 6.5–8.1)

## 2021-04-21 LAB — CBC
HCT: 45.6 % (ref 36.0–46.0)
Hemoglobin: 13.5 g/dL (ref 12.0–15.0)
MCH: 31 pg (ref 26.0–34.0)
MCHC: 29.6 g/dL — ABNORMAL LOW (ref 30.0–36.0)
MCV: 104.6 fL — ABNORMAL HIGH (ref 80.0–100.0)
Platelets: 212 10*3/uL (ref 150–400)
RBC: 4.36 MIL/uL (ref 3.87–5.11)
RDW: 14.6 % (ref 11.5–15.5)
WBC: 5.1 10*3/uL (ref 4.0–10.5)
nRBC: 0.6 % — ABNORMAL HIGH (ref 0.0–0.2)

## 2021-04-21 LAB — CBG MONITORING, ED
Glucose-Capillary: 111 mg/dL — ABNORMAL HIGH (ref 70–99)
Glucose-Capillary: 136 mg/dL — ABNORMAL HIGH (ref 70–99)
Glucose-Capillary: 139 mg/dL — ABNORMAL HIGH (ref 70–99)

## 2021-04-21 LAB — PROCALCITONIN: Procalcitonin: <0.1 ng/mL

## 2021-04-21 LAB — ECHOCARDIOGRAM COMPLETE: S' Lateral: 3.1 cm

## 2021-04-21 LAB — GLUCOSE, CAPILLARY: Glucose-Capillary: 108 mg/dL — ABNORMAL HIGH (ref 70–99)

## 2021-04-21 MED ORDER — ASPIRIN EC 81 MG PO TBEC
81.0000 mg | DELAYED_RELEASE_TABLET | Freq: Every day | ORAL | Status: DC
  Administered 2021-04-23 – 2021-04-26 (×4): 81 mg via ORAL
  Filled 2021-04-21 (×4): qty 1

## 2021-04-21 MED ORDER — HALOPERIDOL LACTATE 5 MG/ML IJ SOLN
2.0000 mg | Freq: Four times a day (QID) | INTRAMUSCULAR | Status: DC | PRN
  Administered 2021-04-21 (×2): 2 mg via INTRAVENOUS
  Filled 2021-04-21 (×2): qty 1

## 2021-04-21 MED ORDER — QUETIAPINE FUMARATE 25 MG PO TABS
12.5000 mg | ORAL_TABLET | Freq: Two times a day (BID) | ORAL | Status: DC
  Administered 2021-04-22: 12.5 mg via ORAL
  Filled 2021-04-21 (×2): qty 1

## 2021-04-21 MED ORDER — HALOPERIDOL LACTATE 5 MG/ML IJ SOLN: 1.0000 mg | Freq: Once | INTRAMUSCULAR | Status: DC

## 2021-04-21 MED ORDER — METOPROLOL TARTRATE 50 MG PO TABS
100.0000 mg | ORAL_TABLET | Freq: Two times a day (BID) | ORAL | Status: DC
  Administered 2021-04-22 – 2021-04-26 (×8): 100 mg via ORAL
  Filled 2021-04-21 (×8): qty 2

## 2021-04-21 MED ORDER — HALOPERIDOL LACTATE 5 MG/ML IJ SOLN
1.0000 mg | Freq: Once | INTRAMUSCULAR | Status: AC
  Administered 2021-04-21: 1 mg via INTRAVENOUS
  Filled 2021-04-21: qty 1

## 2021-04-21 MED ORDER — SODIUM CHLORIDE 0.9 % IV SOLN
500.0000 mg | INTRAVENOUS | Status: DC
  Administered 2021-04-21: 500 mg via INTRAVENOUS
  Filled 2021-04-21 (×3): qty 500

## 2021-04-21 MED ORDER — ATORVASTATIN CALCIUM 10 MG PO TABS
10.0000 mg | ORAL_TABLET | Freq: Every day | ORAL | Status: DC
  Administered 2021-04-23 – 2021-04-26 (×4): 10 mg via ORAL
  Filled 2021-04-21 (×4): qty 1

## 2021-04-21 MED ORDER — ISOSORBIDE DINITRATE 30 MG PO TABS
30.0000 mg | ORAL_TABLET | Freq: Every day | ORAL | Status: DC
  Administered 2021-04-23 – 2021-04-26 (×4): 30 mg via ORAL
  Filled 2021-04-21 (×5): qty 1

## 2021-04-21 MED ORDER — IPRATROPIUM-ALBUTEROL 0.5-2.5 (3) MG/3ML IN SOLN
3.0000 mL | Freq: Four times a day (QID) | RESPIRATORY_TRACT | Status: DC
  Administered 2021-04-22 (×4): 3 mL via RESPIRATORY_TRACT
  Filled 2021-04-21 (×4): qty 3

## 2021-04-21 NOTE — ED Notes (Signed)
This RN heard screaming coming from pt's room. Went to assess, and family had left, pt had removed all monitoring and 6LNC that she was wearing. Sats in 60%'s. O2 applied again, but pt states it is burning her nose and keeps removing. Humidification now applied, continuing to assess frequently.

## 2021-04-21 NOTE — ED Notes (Signed)
Pt soiled, as Purewick had come out of place. Pt cleaned and sheets, brief changed with family member assistance.

## 2021-04-21 NOTE — TOC Initial Note (Signed)
Transition of Care [facility]) - Initial/Assessment Note    Patient Details  Name: Jessica Nash MRN: [id number] Date of Birth: 03/05/1933  [facility]) CM/SW Contact:    Ova Freshwater Phone Number: [phone] 04/21/2021, 1:01 PM  Clinical Narrative:                 Patient presents to [facility] from home due to altered mental status and abdominal pain for three days. Main contact and care giver Long,Francine (Daughter) [phone] or [phone].  CSW called and left voicemail for patient's daughter.  Patient was recently moved to [place] from [place] and lives with daughter.  Patient uses 2L O2 chronically at baseline.  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, chronic at baseline) Living arrangements for the past 2 months: Single Family Home                                      Prior Living Arrangements/Services Living arrangements for the past 2 months: Single Family Home Lives with:: Adult Children Scheryl Marten (Daughter)   [phone] (Home Phone)) Patient language and need for interpreter reviewed:: Yes        Need for Family Participation in Patient Care: Yes (Comment) Care giver support system in place?: Yes (comment)   Criminal Activity/Legal Involvement Pertinent to Current Situation/Hospitalization: No - Comment as needed  Activities of Daily Living      Permission Sought/Granted Permission sought to share information with : Family Supports Permission granted to share information with : Yes, Verbal Permission Granted  Share Information  with NAME: Long,Francine (Daughter)   [phone] (Home Phone)           Emotional Assessment Appearance:: Appears stated age Attitude/Demeanor/Rapport: Unable to Assess Affect (typically observed): Unable to Assess Orientation: : Fluctuating Orientation (Suspected and/or reported Sundowners) Alcohol / Substance Use: Not Applicable Psych Involvement: No (comment)  Admission diagnosis:  Acute on chronic respiratory failure ([place]) [J96.20] Patient Active Problem List   Diagnosis Date Noted   Acute on chronic respiratory failure ([place]) 04/20/2021   Blind    Myalgia 06/19/2012   Blindness of both eyes 06/08/2011   Breast cancer ([place])    CONSTIPATION, CHRONIC 06/10/2010   DECREASED HEARING 08/16/2009   Acute lower UTI 07/14/2008   CAD (coronary artery disease) 07/07/2008   OSTEOARTHRITIS 06/12/2008   Myocardial infarction ([place]) 2010   Hypercapnic respiratory failure ([place]) 2010   HYPERLIPIDEMIA 02/15/2007   TUBULOVILLOUS ADENOMA, 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   DIABETES MELLITUS, TYPE II 10/09/2006   GLAUCOMA 10/09/2006   Hypertension 10/09/2006   GERD 10/09/2006   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

## 2021-04-21 NOTE — Progress Notes (Signed)
*  PRELIMINARY RESULTS* Echocardiogram 2D Echocardiogram has been performed.  Jessica Nash 04/21/2021, 10:17 AM

## 2021-04-21 NOTE — ED Notes (Signed)
New IV started to L hand, with assistance of 2 other RN's holding pt's arms securely. IV dose of Haldol given

## 2021-04-21 NOTE — ED Notes (Signed)
Heard pt yelling, went to room. Pt had removed O2, sats 72% on RA. Pt upset, wanting to leave, not wanting to wear O2 or monitor, pulling everything off. MD notified by chat. ED charge notified and sitter to bedside. See orders.

## 2021-04-21 NOTE — TOC Progression Note (Signed)
Transition of Care [facility]) - Progression Note    Patient Details  Name: Jessica Nash MRN: [id number] Date of Birth: 1932-08-19  Transition [facility]) CM/SW [place], [place] Phone Number: [phone] 04/21/2021, 2:00 PM  Clinical Narrative:     CSW spoke with patient's daughter and main contact/care giver Long,Francine (Daughter) [phone] or [phone].  Ms. Laverta Baltimore stated they refuse SNF placement, and plan for discharge is patient to return home with PT/OT/ and RN home health.  CSW updated Attending and EDStaff on patient preference for discharge. CSW contacted Ramond Marrow w/ [facility] for home health services PT/OT/RN and updated them, patient newly moved to [facility], does not have PCP yet. CSW updated Attending and EDRN and requested home health orders.   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

## 2021-04-21 NOTE — Progress Notes (Signed)
PROGRESS NOTE  Jessica Nash CXK:[id number] DOB: Nov 06, 1932 DOA: 04/20/2021 PCP: Pcp, No   LOS: 1 day   Brief Narrative / Interim history: 87 year old female with chronic respiratory failure on 2 L of oxygen at home, CAD on dual antiplatelet therapy, legally blind, who was brought into the hospital for mental status changes.  History is obtained from daughter over the phone as patient is confused, and daughter tells me that she has been more confused over the last couple of days.  She also mentions that for "quite some time" patient has been having intermittent memory problems with occasional forgetfulness.  She was recently hospitalized about 4 weeks ago in [place] and that is when she was diagnosed with hypoxia and placed on oxygen.  Subjective / 24h Interval events: Confused this morning, alert but does not answer directly my questions, has mittens on and is trying to pull them off  Assessment & Plan: Principal Problem Acute on chronic hypoxic and hypercarbic respiratory failure-not sure about underlying etiology, wonder if she has underlying COPD -Continue supplemental oxygen to maintain sats above 90%, ABG had good improvement last night and she is no longer on BiPAP this morning -Place on DuoNebs  Active Problems Acute metabolic encephalopathy-based on my discussion with the daughter it is possible that she may have mild underlying dementia, likely worsened by the respiratory failure and hospitalization.  Continue to monitor  ?  CHF-chest x-ray was suggestive of mild pulmonary edema with bilateral pleural effusions, BNP was elevated.  She was placed on Lasix, continue -2D echo pending  CAD-daughter reports that she has had stents in the past.  She is on aspirin and Plavix which is now on hold due to concern for dark stools.  Hemoglobin stable, resume aspirin today  Concern for dark stools-monitor while here, hemoglobin is stable this morning compared to last night.  Check fecal  occult, continue PPI  Possible UTI-she has been placed on ceftriaxone, continue while awaiting cultures  Type 2 diabetes mellitus-continue sliding scale, hold oral agents.  A1c 7.0  History of breast cancer-hold Arimidex until patient's mental status improves  Scheduled Meds:  brimonidine  1 drop Both Eyes BID   And   timolol  1 drop Both Eyes BID   furosemide  40 mg Intravenous Daily   haloperidol lactate  1 mg Intramuscular Once   insulin aspart  0-15 Units Subcutaneous Q4H   latanoprost  1 drop Both Eyes QHS   [START ON 04/23/2021] pantoprazole  40 mg Intravenous Q12H   QUEtiapine  12.5 mg Oral BID   sodium chloride flush  3 mL Intravenous Q12H   Continuous Infusions:  sodium chloride     cefTRIAXone (ROCEPHIN)  IV Stopped (04/20/21 1901)   pantoprazole 8 mg/hr (04/21/21 0745)   PRN Meds:.sodium chloride, acetaminophen **OR** acetaminophen, haloperidol lactate, ondansetron **OR** ondansetron (ZOFRAN) IV, sodium chloride flush  Diet Orders (From admission, onward)     Start     Ordered   04/20/21 1551  Diet NPO time specified  Diet effective now        04/20/21 1552            DVT prophylaxis: SCDs Start: 04/20/21 1550     Code Status: DNR  Family Communication: Discussed with daughter over the phone  Status is: Inpatient  Remains inpatient appropriate because: confusion  Level of care: Progressive  Consultants:  Palliative  Procedures:  2D echo: pending  Microbiology  none  Antimicrobials: Ceftriaxone     Objective: Vitals:  04/21/21 0630 04/21/21 0645 04/21/21 0700 04/21/21 0800  BP: (!) 149/69  (!) 168/88 (!) 142/80  Pulse: (!) 109 (!) 108 (!) 106 (!) 109  Resp: (!) 21 19 (!) 22 20  Temp:      TempSrc:      SpO2: 96% 95% 91% 95%    Intake/Output Summary (Last 24 hours) at 04/21/2021 0949 Last data filed at 04/20/2021 1901 Gross per 24 hour  Intake 100 ml  Output --  Net 100 ml   There were no vitals filed for this  visit.  Examination:  Constitutional: NAD Eyes: no scleral icterus ENMT: Mucous membranes are moist.  Neck: normal, supple Respiratory: clear to auscultation bilaterally, no wheezing, no crackles. Normal respiratory effort. No accessory muscle use.  Cardiovascular: Regular rate and rhythm, no murmurs / rubs / gallops. No LE edema. Good peripheral pulses Abdomen: non distended, no tenderness. Bowel sounds positive.  Musculoskeletal: no clubbing / cyanosis.  Skin: no rashes Neurologic: Does not follow commands consistently but nonfocal  Data Reviewed: I have independently reviewed following labs and imaging studies  CBC: Recent Labs  Lab 04/20/21 1155 04/21/21 0805  WBC 7.4 5.1  HGB 13.5 13.5  HCT 45.4 45.6  MCV 106.6* 104.6*  PLT 258 440   Basic Metabolic Panel: Recent Labs  Lab 04/20/21 1155 04/21/21 0805  NA 140 144  K 4.7 4.3  CL 97* 100  CO2 34* 36*  GLUCOSE 179* 102*  BUN 30* 28*  CREATININE 1.34* 1.38*  CALCIUM 9.2 9.3   Liver Function Tests: Recent Labs  Lab 04/20/21 1155 04/21/21 0805  AST 19 18  ALT 12 12  ALKPHOS 51 47  BILITOT 0.7 0.8  PROT 7.6 7.5  ALBUMIN 3.8 3.7   Coagulation Profile: Recent Labs  Lab 04/20/21 1155  INR 1.2   HbA1C: Recent Labs    04/20/21 1814  HGBA1C 7.0*   CBG: Recent Labs  Lab 04/20/21 1811 04/20/21 2139 04/21/21 0740  GLUCAP 149* 110* 111*    Recent Results (from the past 240 hour(s))  Blood culture (single)     Status: None (Preliminary result)   Collection Time: 04/20/21 11:50 AM   Specimen: BLOOD  Result Value Ref Range Status   Specimen Description BLOOD BLOOD RIGHT FOREARM  Final   Special Requests   Final    BOTTLES DRAWN AEROBIC AND ANAEROBIC Blood Culture adequate volume   Culture   Final    NO GROWTH < 24 HOURS Performed at [facility], [address] [place], [place] [address]    Report Status PENDING  Incomplete  Resp Panel by RT-PCR (Flu A&B, Covid) Nasopharyngeal Swab      Status: None   Collection Time: 04/20/21 11:55 AM   Specimen: Nasopharyngeal Swab; Nasopharyngeal(NP) swabs in vial transport medium  Result Value Ref Range Status   SARS Coronavirus 2 by RT PCR NEGATIVE NEGATIVE Final    Comment: (NOTE) SARS-CoV-2 target nucleic acids are NOT DETECTED.  The SARS-CoV-2 RNA is generally detectable in upper respiratory specimens during the acute phase of infection. The lowest concentration of SARS-CoV-2 viral copies this assay can detect is 138 copies/mL. A negative result does not preclude SARS-Cov-2 infection and should not be used as the sole basis for treatment or other patient management decisions. A negative result may occur with  improper specimen collection/handling, submission of specimen other than nasopharyngeal swab, presence of viral mutation(s) within the areas targeted by this assay, and inadequate number of viral copies(<138 copies/mL). A negative  result must be combined with clinical observations, patient history, and epidemiological information. The expected result is Negative.  Fact Sheet for Patients:  EntrepreneurPulse.com.au  Fact Sheet for Healthcare Providers:  IncredibleEmployment.be  This test is no t yet approved or cleared by the [place] FDA and  has been authorized for detection and/or diagnosis of SARS-CoV-2 by FDA under an Emergency Use Authorization (EUA). This EUA will remain  in effect (meaning this test can be used) for the duration of the COVID-19 declaration under Section 564(b)(1) of the Act, 21 U.S.C.section 360bbb-3(b)(1), unless the authorization is terminated  or revoked sooner.       Influenza A by PCR NEGATIVE NEGATIVE Final   Influenza B by PCR NEGATIVE NEGATIVE Final    Comment: (NOTE) The Xpert Xpress SARS-CoV-2/FLU/RSV plus assay is intended as an aid in the diagnosis of influenza from Nasopharyngeal swab specimens and should not be used as a sole basis  for treatment. Nasal washings and aspirates are unacceptable for Xpert Xpress SARS-CoV-2/FLU/RSV testing.  Fact Sheet for Patients: EntrepreneurPulse.com.au  Fact Sheet for Healthcare Providers: IncredibleEmployment.be  This test is not yet approved or cleared by the [place] FDA and has been authorized for detection and/or diagnosis of SARS-CoV-2 by FDA under an Emergency Use Authorization (EUA). This EUA will remain in effect (meaning this test can be used) for the duration of the COVID-19 declaration under Section 564(b)(1) of the Act, 21 U.S.C. section 360bbb-3(b)(1), unless the authorization is terminated or revoked.  Performed at [facility], [address], [place], [place] [address]      Radiology Studies: CT HEAD WO CONTRAST (5MM)  Result Date: 04/20/2021 CLINICAL DATA:  Mental status change, unknown cause. EXAM: CT HEAD WITHOUT CONTRAST TECHNIQUE: Contiguous axial images were obtained from the base of the skull through the vertex without intravenous contrast. COMPARISON:  Head CT 07/04/2008. FINDINGS: Brain: Cerebral volume is unremarkable for age. Mild patchy and ill-defined hypoattenuation within the cerebral white matter, nonspecific but compatible with chronic small vessel ischemic disease. There is no acute intracranial hemorrhage. No demarcated cortical infarct. No extra-axial fluid collection. No evidence of an intracranial mass. No midline shift. Vascular: No hyperdense vessel.  Atherosclerotic calcifications. Skull: Normal. Negative for fracture or focal lesion. Sinuses/Orbits: Visualized orbits show no acute finding. Left phthisis bulbi. Mild mucosal thickening within the left frontal sinus. Mild mucosal thickening versus fluid within a posterior left ethmoid air cell. IMPRESSION: No evidence of acute intracranial abnormality. Mild chronic small vessel ischemic changes within the cerebral white matter, progressed from  the head CT of 07/04/2008. Paranasal sinus disease at the imaged levels, as described. Left phthisis bulbi. Electronically Signed   By: Kellie Simmering D.O.   On: 04/20/2021 15:00   DG Chest Portable 1 View  Result Date: 04/20/2021 CLINICAL DATA:  Hypoxia. Abdominal pain. Nausea. Evaluate infiltrate. EXAM: PORTABLE CHEST 1 VIEW COMPARISON:  01/15/2009 FINDINGS: Midline trachea. Cardiomegaly accentuated by AP portable technique. Probable small bilateral pleural effusions. No pneumothorax. Right greater than left lower lung predominant interstitial and airspace disease. IMPRESSION: Right greater than left interstitial and airspace disease is lower lung predominant. Pulmonary edema and/or infection. Probable small bilateral pleural effusions. Aortic Atherosclerosis (ICD10-I70.0). Electronically Signed   By: Abigail Miyamoto M.D.   On: 04/20/2021 11:32     Marzetta Board, MD, PhD [facility]  Between 7 am - 7 pm I am available, please contact me via Amion (for emergencies) or Securechat (non urgent messages)  Between 7 pm - 7 am  I am not available, please contact night coverage MD/APP via Amion

## 2021-04-21 NOTE — ED Notes (Signed)
Pt removed O2 again, sats dropped to 65% - reapplied O2 and mitts now placed. Attempting to get safety sitter

## 2021-04-22 DIAGNOSIS — J9602 Acute respiratory failure with hypercapnia: Secondary | ICD-10-CM | POA: Diagnosis not present

## 2021-04-22 DIAGNOSIS — Z7189 Other specified counseling: Secondary | ICD-10-CM

## 2021-04-22 DIAGNOSIS — N390 Urinary tract infection, site not specified: Secondary | ICD-10-CM | POA: Diagnosis not present

## 2021-04-22 DIAGNOSIS — R410 Disorientation, unspecified: Secondary | ICD-10-CM

## 2021-04-22 DIAGNOSIS — Z515 Encounter for palliative care: Secondary | ICD-10-CM

## 2021-04-22 DIAGNOSIS — R451 Restlessness and agitation: Secondary | ICD-10-CM

## 2021-04-22 DIAGNOSIS — J9601 Acute respiratory failure with hypoxia: Secondary | ICD-10-CM | POA: Diagnosis not present

## 2021-04-22 DIAGNOSIS — J9622 Acute and chronic respiratory failure with hypercapnia: Secondary | ICD-10-CM | POA: Diagnosis not present

## 2021-04-22 LAB — COMPREHENSIVE METABOLIC PANEL
ALT: 12 U/L (ref 0–44)
AST: 23 U/L (ref 15–41)
Albumin: 3.7 g/dL (ref 3.5–5.0)
Alkaline Phosphatase: 43 U/L (ref 38–126)
Anion gap: 10 (ref 5–15)
BUN: 24 mg/dL — ABNORMAL HIGH (ref 8–23)
CO2: 36 mmol/L — ABNORMAL HIGH (ref 22–32)
Calcium: 9.4 mg/dL (ref 8.9–10.3)
Chloride: 97 mmol/L — ABNORMAL LOW (ref 98–111)
Creatinine, Ser: 1.06 mg/dL — ABNORMAL HIGH (ref 0.44–1.00)
GFR, Estimated: 51 mL/min — ABNORMAL LOW (ref >60)
Glucose, Bld: 104 mg/dL — ABNORMAL HIGH (ref 70–99)
Potassium: 4.2 mmol/L (ref 3.5–5.1)
Sodium: 143 mmol/L (ref 135–145)
Total Bilirubin: 0.9 mg/dL (ref 0.3–1.2)
Total Protein: 7.5 g/dL (ref 6.5–8.1)

## 2021-04-22 LAB — RESPIRATORY PANEL BY PCR

## 2021-04-22 LAB — CBC
HCT: 41.8 % (ref 36.0–46.0)
Hemoglobin: 13 g/dL (ref 12.0–15.0)
MCH: 31.9 pg (ref 26.0–34.0)
MCHC: 31.1 g/dL (ref 30.0–36.0)
MCV: 102.5 fL — ABNORMAL HIGH (ref 80.0–100.0)
Platelets: 241 10*3/uL (ref 150–400)
RBC: 4.08 MIL/uL (ref 3.87–5.11)
RDW: 14.8 % (ref 11.5–15.5)
WBC: 6.8 10*3/uL (ref 4.0–10.5)
nRBC: 0.3 % — ABNORMAL HIGH (ref 0.0–0.2)

## 2021-04-22 LAB — GLUCOSE, CAPILLARY
Glucose-Capillary: 108 mg/dL — ABNORMAL HIGH (ref 70–99)
Glucose-Capillary: 112 mg/dL — ABNORMAL HIGH (ref 70–99)
Glucose-Capillary: 120 mg/dL — ABNORMAL HIGH (ref 70–99)
Glucose-Capillary: 171 mg/dL — ABNORMAL HIGH (ref 70–99)
Glucose-Capillary: 203 mg/dL — ABNORMAL HIGH (ref 70–99)

## 2021-04-22 LAB — PROCALCITONIN: Procalcitonin: <0.1 ng/mL

## 2021-04-22 LAB — TSH: TSH: 0.371 u[IU]/mL (ref 0.350–4.500)

## 2021-04-22 LAB — D-DIMER, QUANTITATIVE: D-Dimer, Quant: 1.93 ug/mL-FEU — ABNORMAL HIGH (ref 0.00–0.50)

## 2021-04-22 MED ORDER — MORPHINE SULFATE (CONCENTRATE) 10 MG/0.5ML PO SOLN
5.0000 mg | ORAL | Status: DC | PRN
  Administered 2021-04-22 – 2021-04-25 (×10): 5 mg via ORAL
  Filled 2021-04-22 (×10): qty 0.5

## 2021-04-22 MED ORDER — HYDROXYZINE HCL 50 MG/ML IM SOLN
25.0000 mg | Freq: Once | INTRAMUSCULAR | Status: AC
  Administered 2021-04-22: 25 mg via INTRAMUSCULAR
  Filled 2021-04-22: qty 0.5

## 2021-04-22 MED ORDER — HALOPERIDOL LACTATE 5 MG/ML IJ SOLN
INTRAMUSCULAR | Status: AC
  Filled 2021-04-22: qty 1

## 2021-04-22 MED ORDER — HALOPERIDOL LACTATE 5 MG/ML IJ SOLN
5.0000 mg | Freq: Four times a day (QID) | INTRAMUSCULAR | Status: DC | PRN
  Administered 2021-04-22 – 2021-04-24 (×6): 5 mg via INTRAMUSCULAR
  Filled 2021-04-22 (×5): qty 1

## 2021-04-22 NOTE — Progress Notes (Signed)
   04/22/21 0755  Assess: MEWS Score  Temp 99 F (37.2 C)  BP (!) 125/108  Pulse Rate (!) 118  ECG Heart Rate (!) 118  Resp 20  Level of Consciousness Alert  SpO2 94 %  O2 Device (S)  Room Air  Assess: MEWS Score  MEWS Temp 0  MEWS Systolic 0  MEWS Pulse 2  MEWS RR 0  MEWS LOC 0  MEWS Score 2  MEWS Score Color Yellow  Assess: if the MEWS score is Yellow or Red  Were vital signs taken at a resting state? Yes  Focused Assessment No change from prior assessment  Does the patient meet 2 or more of the SIRS criteria? No  MEWS guidelines implemented *See Row Information* Yes  Treat  MEWS Interventions Escalated (See documentation below)  Take Vital Signs  Increase Vital Sign Frequency  Yellow: Q 2hr X 2 then Q 4hr X 2, if remains yellow, continue Q 4hrs  Escalate  MEWS: Escalate Yellow: discuss with charge nurse/RN and consider discussing with provider and RRT  Notify: Charge Nurse/RN  Name of Charge Nurse/RN Notified Linard Millers  Date Charge Nurse/RN Notified 04/22/21  Time Charge Nurse/RN Notified 0800  Notify: Provider  Provider Name/Title Dr. Cruzita Lederer  Date Provider Notified 04/22/21  Time Provider Notified 0830  Notification Type Page  Notification Reason Change in status (Yellow MEWS)  Document  Patient Outcome Stabilized after interventions  Progress note created (see row info) Yes  Assess: SIRS CRITERIA  SIRS Temperature  0  SIRS Pulse 1  SIRS Respirations  0  SIRS WBC 0  SIRS Score Sum  1

## 2021-04-22 NOTE — Plan of Care (Signed)
  Problem: Clinical Measurements: Goal: Ability to maintain a body temperature in the normal range will improve Outcome: Progressing   Problem: Urinary Elimination: Goal: Signs and symptoms of infection will decrease Outcome: Progressing

## 2021-04-22 NOTE — Progress Notes (Addendum)
Chaplain responded to order requisition for support.  Pt was lying on her side and moaning softly.  Daughter who was present reports that pt does this as a way to resist sleep medicine.  Provided emotional support and prayer.  Pt roused easily to greet chaplain. Pt expressed gratitude for support, but then returned to soft moaning.  Please contact for follow-up as needed or requested.  Luana Shu [phone]   04/22/21 1930  Clinical Encounter Type  Visited With Patient  Visit Type Initial;Spiritual support  Referral From Patient  Consult/Referral To Chaplain  Spiritual Encounters  Spiritual Needs Prayer  Stress Factors  Patient Stress Factors Health changes (spiritual anxiety)

## 2021-04-22 NOTE — Progress Notes (Incomplete)
Patient is still agitated and confused this morning. She has removed her clothing and the heated high flow device.

## 2021-04-22 NOTE — Progress Notes (Signed)
Patient slept for about 2 hours tonight after receiving haldol 2mg  IV and visteril 25mg  IM. She woke up pulling at medical equipment. She pulled out a second IV that had been placed by IV team. The site was wrapped with kerlex and she is wearing safety mittens.She is also pulling at the tubing to the heated high flow. Family was with patient until about 0230. She may benefit from a Air cabin crew when family is not at bedside. Attending B. Randol Kern updated in person on the unit.

## 2021-04-22 NOTE — Progress Notes (Signed)
Pt became antagonized when SVN initiated

## 2021-04-22 NOTE — Progress Notes (Signed)
Patient received very agitated and uncooperative with bed alarm sounding. Daughter is bedside and attempting to redirect. PRN haldol administered with moderate effects. Patient dis allow RN to administered mediations, collect vital signs, and assisted OOB to bedside commode. Tool sample collected and sent to lab. Will cont to monitor.

## 2021-04-22 NOTE — Progress Notes (Signed)
PROGRESS NOTE  Jessica Nash GEX:[id number] DOB: 04-23-1933 DOA: 04/20/2021 PCP: Pcp, No   LOS: 2 days   Brief Narrative / Interim history: 88 year old female with chronic respiratory failure on 2 L of oxygen at home, CAD on dual antiplatelet therapy, legally blind, who was brought into the hospital for mental status changes.  History is obtained from daughter over the phone as patient is confused, and daughter tells me that she has been more confused over the last couple of days.  She also mentions that for "quite some time" patient has been having intermittent memory problems with occasional forgetfulness.  She was recently hospitalized about 4 weeks ago in [place] and that is when she was diagnosed with hypoxia and placed on oxygen.  Subjective / 24h Interval events: Remains confused, agitated.  Pulled out all of her lines and removing her oxygen  Assessment & Plan: Principal Problem Acute on chronic hypoxic and hypercarbic respiratory failure-not sure about underlying etiology, wonder if she has underlying COPD -Continue supplemental oxygen to maintain sats above 90%, ABG looks improved, no longer needs BiPAP -Place on DuoNebs  Active Problems Acute metabolic encephalopathy-based on my discussion with the daughter it is possible that she may have mild underlying dementia, likely worsened by the respiratory failure and hospitalization.  Continue to monitor.  I wonder whether her altered mental status could have some other causes such as a CVA however I am unable to obtain an MRI at this point due to agitation  Acute on chronic diastolic CHF-chest x-ray was suggestive of mild pulmonary edema with bilateral pleural effusions, BNP was elevated.  Continue Lasix -2D echo showed normal EF  CAD-daughter reports that she has had stents in the past.  She is on aspirin and Plavix which is now on hold due to concern for dark stools.  Hemoglobin remained stable  Concern for dark stools-hemoglobin  remained stable, no evidence of GI bleed at this point  Possible UTI-she has been placed on ceftriaxone, continue while awaiting cultures  Type 2 diabetes mellitus-continue sliding scale, hold oral agents.  A1c 7.0  History of breast cancer-hold Arimidex until patient's mental status improves  Scheduled Meds:  aspirin EC  81 mg Oral Daily   atorvastatin  10 mg Oral Daily   brimonidine  1 drop Both Eyes BID   And   timolol  1 drop Both Eyes BID   furosemide  40 mg Intravenous Daily   insulin aspart  0-15 Units Subcutaneous Q4H   ipratropium-albuterol  3 mL Nebulization Q6H   isosorbide dinitrate  30 mg Oral Daily   latanoprost  1 drop Both Eyes QHS   metoprolol tartrate  100 mg Oral BID   [START ON 04/23/2021] pantoprazole  40 mg Intravenous Q12H   QUEtiapine  12.5 mg Oral BID   sodium chloride flush  3 mL Intravenous Q12H   Continuous Infusions:  sodium chloride Stopped (04/21/21 2303)   azithromycin 500 mg (04/21/21 1651)   cefTRIAXone (ROCEPHIN)  IV Stopped (04/21/21 2303)   PRN Meds:.sodium chloride, acetaminophen **OR** acetaminophen, haloperidol lactate, ondansetron **OR** ondansetron (ZOFRAN) IV, sodium chloride flush  Diet Orders (From admission, onward)     Start     Ordered   04/22/21 0953  DIET - DYS 1 Room service appropriate? No; Fluid consistency: Thin  Diet effective now       Question Answer Comment  Room service appropriate? No   Fluid consistency: Thin      04/22/21 0953  DVT prophylaxis: SCDs Start: 04/20/21 1550     Code Status: DNR  Family Communication: Discussed with daughter over the phone  Status is: Inpatient  Remains inpatient appropriate because: confusion  Level of care: Progressive  Consultants:  Palliative  Procedures:  2D echo: pending  Microbiology  none  Antimicrobials: Ceftriaxone     Objective: Vitals:   04/21/21 2326 04/22/21 0325 04/22/21 0755 04/22/21 1111  BP: (!) 140/114 (!) 141/66 (!)  125/108 100/65  Pulse: (!) 115  (!) 118 (!) 116  Resp: (!) 21  20 16   Temp: 98.7 F (37.1 C) 99 F (37.2 C) 99 F (37.2 C) 98.7 F (37.1 C)  TempSrc: Oral Axillary Axillary   SpO2: 98% 94% 94% 91%    Intake/Output Summary (Last 24 hours) at 04/22/2021 1334 Last data filed at 04/22/2021 0755 Gross per 24 hour  Intake 452.69 ml  Output --  Net 452.69 ml    There were no vitals filed for this visit.  Examination:  Constitutional: Agitated Eyes: No scleral icterus ENMT: mmm Neck: normal, supple Respiratory: Clear bilaterally, no wheezing, no crackles, normal respiratory effort Cardiovascular: rrr, no mrg, no edema Abdomen: soft, nt, nd, bs+ Musculoskeletal: no clubbing / cyanosis.  Skin: no rashes Neurologic: agitated, does not appear to have any focal deficits  Data Reviewed: I have independently reviewed following labs and imaging studies  CBC: Recent Labs  Lab 04/20/21 1155 04/21/21 0805 04/22/21 0504  WBC 7.4 5.1 6.8  HGB 13.5 13.5 13.0  HCT 45.4 45.6 41.8  MCV 106.6* 104.6* 102.5*  PLT 258 212 673    Basic Metabolic Panel: Recent Labs  Lab 04/20/21 1155 04/21/21 0805 04/22/21 0504  NA 140 144 143  K 4.7 4.3 4.2  CL 97* 100 97*  CO2 34* 36* 36*  GLUCOSE 179* 102* 104*  BUN 30* 28* 24*  CREATININE 1.34* 1.38* 1.06*  CALCIUM 9.2 9.3 9.4    Liver Function Tests: Recent Labs  Lab 04/20/21 1155 04/21/21 0805 04/22/21 0504  AST 19 18 23   ALT 12 12 12   ALKPHOS 51 47 43  BILITOT 0.7 0.8 0.9  PROT 7.6 7.5 7.5  ALBUMIN 3.8 3.7 3.7    Coagulation Profile: Recent Labs  Lab 04/20/21 1155  INR 1.2    HbA1C: Recent Labs    04/20/21 1814  HGBA1C 7.0*    CBG: Recent Labs  Lab 04/21/21 1618 04/21/21 2010 04/22/21 0313 04/22/21 0749 04/22/21 1116  GLUCAP 139* 108* 108* 112* 120*     Recent Results (from the past 240 hour(s))  Blood culture (single)     Status: None (Preliminary result)   Collection Time: 04/20/21 11:50 AM    Specimen: BLOOD  Result Value Ref Range Status   Specimen Description BLOOD BLOOD RIGHT FOREARM  Final   Special Requests   Final    BOTTLES DRAWN AEROBIC AND ANAEROBIC Blood Culture adequate volume   Culture   Final    NO GROWTH 2 DAYS Performed at [facility], [address], [place], [place] [address]    Report Status PENDING  Incomplete  Resp Panel by RT-PCR (Flu A&B, Covid) Nasopharyngeal Swab     Status: None   Collection Time: 04/20/21 11:55 AM   Specimen: Nasopharyngeal Swab; Nasopharyngeal(NP) swabs in vial transport medium  Result Value Ref Range Status   SARS Coronavirus 2 by RT PCR NEGATIVE NEGATIVE Final    Comment: (NOTE) SARS-CoV-2 target nucleic acids are NOT DETECTED.  The SARS-CoV-2 RNA is generally detectable  in upper respiratory specimens during the acute phase of infection. The lowest concentration of SARS-CoV-2 viral copies this assay can detect is 138 copies/mL. A negative result does not preclude SARS-Cov-2 infection and should not be used as the sole basis for treatment or other patient management decisions. A negative result may occur with  improper specimen collection/handling, submission of specimen other than nasopharyngeal swab, presence of viral mutation(s) within the areas targeted by this assay, and inadequate number of viral copies(<138 copies/mL). A negative result must be combined with clinical observations, patient history, and epidemiological information. The expected result is Negative.  Fact Sheet for Patients:  EntrepreneurPulse.com.au  Fact Sheet for Healthcare Providers:  IncredibleEmployment.be  This test is no t yet approved or cleared by the [place] FDA and  has been authorized for detection and/or diagnosis of SARS-CoV-2 by FDA under an Emergency Use Authorization (EUA). This EUA will remain  in effect (meaning this test can be used) for the duration of the COVID-19 declaration  under Section 564(b)(1) of the Act, 21 U.S.C.section 360bbb-3(b)(1), unless the authorization is terminated  or revoked sooner.       Influenza A by PCR NEGATIVE NEGATIVE Final   Influenza B by PCR NEGATIVE NEGATIVE Final    Comment: (NOTE) The Xpert Xpress SARS-CoV-2/FLU/RSV plus assay is intended as an aid in the diagnosis of influenza from Nasopharyngeal swab specimens and should not be used as a sole basis for treatment. Nasal washings and aspirates are unacceptable for Xpert Xpress SARS-CoV-2/FLU/RSV testing.  Fact Sheet for Patients: EntrepreneurPulse.com.au  Fact Sheet for Healthcare Providers: IncredibleEmployment.be  This test is not yet approved or cleared by the [place] FDA and has been authorized for detection and/or diagnosis of SARS-CoV-2 by FDA under an Emergency Use Authorization (EUA). This EUA will remain in effect (meaning this test can be used) for the duration of the COVID-19 declaration under Section 564(b)(1) of the Act, 21 U.S.C. section 360bbb-3(b)(1), unless the authorization is terminated or revoked.  Performed at [facility], [address] [place], [place] [address]   Respiratory (~20 pathogens) panel by PCR     Status: None   Collection Time: 04/21/21  2:58 PM   Specimen: Nasopharyngeal Swab; Respiratory  Result Value Ref Range Status   Adenovirus NOT DETECTED NOT DETECTED Final   Coronavirus 229E NOT DETECTED NOT DETECTED Final    Comment: (NOTE) The Coronavirus on the Respiratory Panel, DOES NOT test for the novel  Coronavirus (2019 nCoV)    Coronavirus HKU1 NOT DETECTED NOT DETECTED Final   Coronavirus NL63 NOT DETECTED NOT DETECTED Final   Coronavirus OC43 NOT DETECTED NOT DETECTED Final   Metapneumovirus NOT DETECTED NOT DETECTED Final   Rhinovirus / Enterovirus NOT DETECTED NOT DETECTED Final   Influenza A NOT DETECTED NOT DETECTED Final   Influenza B NOT DETECTED NOT DETECTED  Final   Parainfluenza Virus 1 NOT DETECTED NOT DETECTED Final   Parainfluenza Virus 2 NOT DETECTED NOT DETECTED Final   Parainfluenza Virus 3 NOT DETECTED NOT DETECTED Final   Parainfluenza Virus 4 NOT DETECTED NOT DETECTED Final   Respiratory Syncytial Virus NOT DETECTED NOT DETECTED Final   Bordetella pertussis NOT DETECTED NOT DETECTED Final   Bordetella Parapertussis NOT DETECTED NOT DETECTED Final   Chlamydophila pneumoniae NOT DETECTED NOT DETECTED Final   Mycoplasma pneumoniae NOT DETECTED NOT DETECTED Final    Comment: Performed at [facility], [place]. [address] [place], [place] [address]      Radiology Studies:  No results found.   Marzetta Board, MD, PhD [facility]  Between 7 am - 7 pm I am available, please contact me via Amion (for emergencies) or Securechat (non urgent messages)  Between 7 pm - 7 am I am not available, please contact night coverage MD/APP via Amion

## 2021-04-22 NOTE — Consult Note (Signed)
Palliative Care Consult Note                                  Date: 04/22/2021   Patient Name: Jessica Nash  DOB: 1932-07-09  MRN: [id number]  Age / Sex: 87 y.o., female  PCP: Pcp, No Referring Physician: Caren Griffins, MD  Reason for Consultation: Establishing goals of care  HPI/Patient Profile: 87 y.o. female  with past medical history of chronic respiratory failure on 2 L of oxygen at home, CAD on dual antiplatelet therapy, legally blind who was brought into the hospital for mental status changes. She was admitted on 04/20/2021 with acute on chronic hypoxic and hypercarbic respiratory failure initially requiring BiPAP in ER, acute metabolic encephalopathy, acute on chronic diastolic CHF, and others.   PMT was consulted for goals of care conversation.  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 Dr Bary Castilla   Breast cyst    CAD (coronary artery disease)    Cellulitis and abscess of trunk 2013   Degenerative disc disease    Diabetes mellitus    Diverticulitis    GERD (gastroesophageal reflux disease)    Glaucoma 2010   blindness d/t glaucoma   Hx of colonic polyps    Hx of decompressive lumbar laminectomy    3 Level lumbar decompressive laminotomies with  foraminotomies 02/06   Hyperlipidemia    Hypertension    Myocardial infarction [facility]) 2010   Personal history of tobacco use, presenting hazards to health     Subjective:   This NP Walden Field reviewed medical records, received report from team, assessed the patient and then meet at the patient's bedside to discuss diagnosis, prognosis, GOC, EOL wishes disposition and options.  I met with the patient and an out-of-town sister (who is not POA) at the bedside.   Concept of Palliative Care was introduced as specialized medical care  for people and their families living with serious illness.  If focuses on providing relief from the symptoms and stress of a serious illness.  The goal is to improve quality of life for both the patient and the family. Values and goals of care important to patient and family were attempted to be elicited.  Created space and opportunity for patient  and family to explore thoughts and feelings regarding current medical situation   Natural trajectory and current clinical status were discussed. Questions and concerns addressed. Patient  encouraged to call with questions or concerns.    Patient/Family Understanding of Illness: The patient's daughter understands that she has had progressive confusion.  Also with worsening forgetfulness.  She was previously mobile even though she was blind.  She moved [place] where she lives with her sister-in-law to locally to live with her daughter.  She was independent and at home for up to 8 hours a day by her self.  However, since she was started on oxygen she lost her independence with that.  She has since had significant decline in appetite and failure to thrive.  Confusion seems to be getting worse.  Life Review: The patient was born in [place] and moved to [place] when she was 87 years old.  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  Hobbies and sources of  joy include crafts such as when painting, crochet, hard to maintain.  She also enjoyed cooking and Scientist, physiological.  She has 2 daughters and 3 sons.  2 of her sons passed away and 1 son lives in [place] and is not substantially involved in her life.  Patient Values: Family.  Her daughter also states her independence is very important to her.  Goals: Get back to normal if possible  Today's Discussion: I spoke with her daughter at bedside.  Her daughter expressed that she is not DPOA.  Rather, her Sister Andris Flurry is the power of attorney and primary  caregiver.  Andris Flurry will be here later this afternoon/evening.  The hospitalist is planning on speaking with her.  She states that Andris Flurry is very close to her mom and generally wants her mom to "get back to normal."  We discussed the possibility that with all of her acute changes this seems to portend a overall decline in her health.  We discussed that while we always hope for patients to get better" back to normal" this is not always possible.  If it is not possible, then we will need to discuss where care should progress from here if that is the case.  Her daughter seems to understand this quite well.  However, not being the POA, she is not wanting to make decisions, which is very understandable.  I offered emotional and general support and therapeutic listening, sharing of stories, and other techniques.  I answered all questions and addressed all concerns.  Review of Systems  Unable to perform ROS: Mental status change   Objective:   Primary Diagnoses: Present on Admission:  Acute on chronic respiratory failure (HCC)  Breast cancer ([place])  CAD (coronary artery disease)  Hypertension  Myocardial infarction ([place])  Hypercapnic respiratory failure ([place])  Acute lower UTI   Physical Exam Vitals and nursing note reviewed.  Constitutional:      General: She is not in acute distress.    Appearance: She is ill-appearing.     Comments: Patient very agitated, humming, pulling at oxygen, pulling blankets off.  HENT:     Head: Normocephalic and atraumatic.  Cardiovascular:     Rate and Rhythm: Normal rate.  Pulmonary:     Effort: Pulmonary effort is normal. No respiratory distress.  Skin:    General: Skin is warm and dry.  Psychiatric:        Behavior: Behavior is uncooperative.    Vital Signs:  BP 100/65 (BP Location: Left Leg)   Pulse (!) 116   Temp 98.7 F (37.1 C)   Resp 16   SpO2 91%   Palliative Assessment/Data: 30%    Advanced Care Planning:   Primary Decision  Maker: NEXT OF KIN  Code Status/Advance Care Planning: DNR  A discussion was had today regarding advanced directives. Concepts specific to code status, artifical feeding and hydration, continued IV antibiotics and rehospitalization was had.  The difference between a aggressive medical intervention path and a palliative comfort care path for this patient at this time was had.   Decisions/Changes to ACP: None today Further changes/decisions after meeting with daughter Francine/POA  Assessment & Plan:   Impression: 87 year old female with chronic hypoxic and hypercarbic respiratory failure now with acute on chronic respiratory failure, acute metabolic encephalopathy, agitation, confusion, acute on chronic CHF.  Noted substantial change in mental status over the previous weeks.  Previously independent, but has since lost her independence.  Overall decline in health and functional status.  Generally I  feel prognosis is poor.  Daughter at the bedside expresses that her sister [facility]) likely wants patient "back to normal".  Discussed the need for discussions on where care she progresses this is not possible.  SUMMARY OF RECOMMENDATIONS   Discuss with daughter/POA goals of care Continue to treat the treatable Further discussions as clinical situation evolves PMT will continue to follow  Symptom Management:  Per primary team PMT is available to assist as needed  Prognosis:  Unable to determine  Discharge Planning:  To Be Determined   Discussed with: 70 min  Thank you for allowing [place] to participate in the care of Roux Brandy PMT will continue to support holistically.  Time Total: Medical team, nursing team, patient's daughter  Greater than 50%  of this time was spent counseling and coordinating care related to the above assessment and plan.  Signed by: Walden Field, NP Palliative Medicine Team  Team Phone # [phone] (Nights/Weekends)  04/22/2021, 2:08 PM

## 2021-04-22 NOTE — Progress Notes (Signed)
PT Cancellation Note  Patient Details Name: Jessica Nash MRN: [id number] DOB: [birth year]   Cancelled Treatment:    Reason Eval/Treat Not Completed: Patient's level of consciousness. PT orders received and pt chart reviewed. Per chart and RN report, pt has been refusing everything, even wearing supplemental O2. Upon PT entry, pt sidelying in bed with bed alarm on. SpO2 in mid-low 70's on RA; attempted placing supplemental O2 on pt, but pt declining and pulling mask off. Pt educated regarding benefits of wearing O2 and safety concerns, with pt perseverating and stating that she is wearing O2. Despite pt being agreeable to working with PT, PT to hold evaluation given level of hypoxia with refusal to wear supplemental O2. RN notified. Will continue efforts as appropriate.   Herminio Commons, PT, DPT 9:11 AM,04/22/21

## 2021-04-22 NOTE — Progress Notes (Signed)
   04/22/21 1111  Assess: MEWS Score  Temp 98.7 F (37.1 C)  BP 100/65  Pulse Rate (!) 116  Resp 16  SpO2 91 %  O2 Device Room Air  Assess: MEWS Score  MEWS Temp 0  MEWS Systolic 1  MEWS Pulse 2  MEWS RR 0  MEWS LOC 0  MEWS Score 3  MEWS Score Color Yellow  Assess: if the MEWS score is Yellow or Red  Were vital signs taken at a resting state? Yes  Focused Assessment No change from prior assessment  Does the patient meet 2 or more of the SIRS criteria? No  MEWS guidelines implemented *See Row Information* No, previously yellow, continue vital signs every 4 hours  Assess: SIRS CRITERIA  SIRS Temperature  0  SIRS Pulse 1  SIRS Respirations  0  SIRS WBC 0  SIRS Score Sum  1

## 2021-04-22 NOTE — Evaluation (Signed)
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 Dr Bary Castilla   Breast cyst    CAD (coronary artery disease)    Cellulitis and abscess of trunk 2013   Degenerative disc disease    Diabetes mellitus    Diverticulitis    GERD (gastroesophageal reflux disease)    Glaucoma 2010   blindness d/t glaucoma   Hx of colonic polyps    Hx of decompressive lumbar laminectomy    3 Level lumbar decompressive laminotomies with  foraminotomies 02/06   Hyperlipidemia    Hypertension    Myocardial infarction [facility]) 2010   Personal history of tobacco use, presenting hazards to health    Past Surgical History:  Past Surgical History:  Procedure Laterality Date   ABDOMINAL HYSTERECTOMY  1964   partial   ANGIOPLASTY     2/10 Angioplasty/stent-RCA---------Dr Arida   BACK SURGERY     BREAST BIOPSY Left 2012   BREAST SURGERY Right 2011   biopsy   COLON SURGERY  2006   COLONOSCOPY  2008,2011   Dr. Candace Cruise [facility]   CORONARY ANGIOPLASTY WITH STENT PLACEMENT  2010   done by Annia Belt MD   [place]  01/03-03/03   MASTECTOMY  4/12   Dr Kris Mouton RT after   MASTECTOMY Right 2012   HPI:  Per 49 H&P "Jessica Nash is a 87 y.o. female with medical history significant for chronic respiratory failure on 2 L of oxygen, coronary artery disease on dual antiplatelet therapy, legally blind who was brought into the ER by EMS for evaluation of change in mental status.  Her daughter states that 3 days ago patient was noted to have loose, dark watery stools with  complaints of abdominal discomfort.  She also states that the patient had foul-smelling urine.    She states that patient only takes prescribed medications and denies any NSAID use.  On the morning of her admission patient's daughter states that she found the patient lying across her bed poorly responsive and off her oxygen.  This concerned her so she called EMS and patient was brought to the emergency room.  At baseline according to her daughter she is usually oriented to person, place and time and is able to carry out some activities of daily living.  She states that patient usually takes tramadol as needed for pain and has not had any new medication changes.  I am unable to do a review of systems on this patient due to her mental status changes.  ABG  7.29/85/112/40.9/97.9  Sodium 140, potassium 4.7, chloride 97, bicarb 34, glucose 179, BUN 30, creatinine 1.34, calcium 9.2, alkaline phosphatase 51, albumin 3.8, AST 19, ALT 12, total protein 7.6, total bilirubin 0.7, BNP 454, lactic acid 1.5, procalcitonin less than 0.10, white count 7.4, hemoglobin 13.5, hematocrit 45.4, MCV 100.6, RDW 14.6, platelet count 258, PT 15.4, INR 1.2  Twelve-lead EKG reviewed by me shows sinus tachycardia with right bundle branch block.        ED Course: Patient is an 87 year old female who presents to the  ER via EMS for evaluation of mental status changes.  She was noted to have hypercapnic respiratory failure in the ER and is currently on a BiPAP.  She will be admitted to the hospital for further evaluation." CXR 04/20/21 "Right greater than left interstitial and airspace disease is lower  lung predominant. Pulmonary edema and/or infection.     Probable small bilateral pleural effusions.     Aortic Atherosclerosis" Head CT 04/20/21 "No evidence of acute intracranial abnormality.     Mild chronic small vessel ischemic changes within the cerebral white  matter, progressed from the head CT of 07/04/2008.     Paranasal sinus disease at  the imaged levels, as described.     Left phthisis bulbi."    Assessment / Plan / Recommendation  Clinical Impression  Pt seen for clincal swallowing evaluation. Pt alert. Sidelying in bed. Able to be positoined upright; however, would not lay on back. Pt remained on R side for duration of evaluation. On room air. Confusion evident. Eyes closed during session. Repeatedly stating, "I'm cold." Removing blankets when covered by SLP. Cleared with RN.   Pt given trials of ice chips, water (via tsp and straw sip), and applesauce. Pt presents with s/sx mild oral dysphagia c/b incosistent delayed oral transit with puree trials. Pt benefited from verbal cueing throughout evaluation to improve oral efficiency. Suspect oral deficits attributed to mental status. Pharyngeal swallow appeared functional across consistencies given. No overt s/sx pharyngeal dysphagia. To palpation, seemingly timely swallow initiation and seemingly adequate laryngeal elevation to palpation. Solid trial deferred at this time given overall clinical presentation and mental status.   Recommend initiation of a pureed diet with thin liquids and safe swallowing strategies/aspiration precautions as outlined below.   Pt is at increased risk for aspiration/aspiration PNA given mental status, dental status, dependence for feeding, and multiple medical comorbidities.   RN made aware of diet recommendation, safe swallowing strategies, and SLP POC. Pt also made aware; however, likely limited understanding/retention given AMS.   SLP to f/u per POC for diet tolerance and possible trials of upgraded textures in ~2 days.  SLP Visit Diagnosis: Dysphagia, oral phase (R13.11)    Aspiration Risk       Diet Recommendation Dysphagia 1 (Puree);Thin liquid   Liquid Administration via: Spoon;Straw Medication Administration: Crushed with puree Supervision: Staff to assist with self feeding;Full supervision/cueing for compensatory  strategies Compensations: Minimize environmental distractions;Slow rate;Small sips/bites Postural Changes: Seated upright at 90 degrees    Other  Recommendations Oral Care Recommendations: Oral care BID;Staff/trained caregiver to provide oral care    Recommendations for follow up therapy are one component of a multi-disciplinary discharge planning process, led by the attending physician.  Recommendations may be updated based on patient status, additional functional criteria and insurance authorization.  Follow up Recommendations Skilled nursing-short term rehab (<3 hours/day)      Assistance Recommended at Discharge Frequent or constant Supervision/Assistance  Functional Status Assessment Patient has had a recent decline in their functional status and demonstrates the ability to make significant improvements in function in a reasonable and predictable amount of time.  Frequency and Duration min 2x/week  2 weeks       Prognosis Prognosis for Safe Diet Advancement: Fair Barriers to Reach Goals: Cognitive deficits;Severity of deficits;Behavior      Swallow Study   General Date of Onset: 04/20/21 HPI: Per 50 H&P "Jessica Nash is a 87 y.o. female with medical history significant for chronic respiratory failure on 2 L of oxygen, coronary  artery disease on dual antiplatelet therapy, legally blind who was brought into the ER by EMS for evaluation of change in mental status.  Her daughter states that 3 days ago patient was noted to have loose, dark watery stools with complaints of abdominal discomfort.  She also states that the patient had foul-smelling urine.    She states that patient only takes prescribed medications and denies any NSAID use.  On the morning of her admission patient's daughter states that she found the patient lying across her bed poorly responsive and off her oxygen.  This concerned her so she called EMS and patient was brought to the emergency room.  At baseline  according to her daughter she is usually oriented to person, place and time and is able to carry out some activities of daily living.  She states that patient usually takes tramadol as needed for pain and has not had any new medication changes.  I am unable to do a review of systems on this patient due to her mental status changes.  ABG  7.29/85/112/40.9/97.9  Sodium 140, potassium 4.7, chloride 97, bicarb 34, glucose 179, BUN 30, creatinine 1.34, calcium 9.2, alkaline phosphatase 51, albumin 3.8, AST 19, ALT 12, total protein 7.6, total bilirubin 0.7, BNP 454, lactic acid 1.5, procalcitonin less than 0.10, white count 7.4, hemoglobin 13.5, hematocrit 45.4, MCV 100.6, RDW 14.6, platelet count 258, PT 15.4, INR 1.2  Twelve-lead EKG reviewed by me shows sinus tachycardia with right bundle branch block.        ED Course: Patient is an 87 year old female who presents to the ER via EMS for evaluation of mental status changes.  She was noted to have hypercapnic respiratory failure in the ER and is currently on a BiPAP.  She will be admitted to the hospital for further evaluation." CXR 04/20/21 "Right greater than left interstitial and airspace disease is lower  lung predominant. Pulmonary edema and/or infection.     Probable small bilateral pleural effusions.     Aortic Atherosclerosis" Head CT 04/20/21 "No evidence of acute intracranial abnormality.     Mild chronic small vessel ischemic changes within the cerebral white  matter, progressed from the head CT of 07/04/2008.     Paranasal sinus disease at the imaged levels, as described.     Left phthisis bulbi." Type of Study: Bedside Swallow Evaluation Previous Swallow Assessment: unknown Diet Prior to this Study: NPO (baseline diet unknown) Temperature Spikes Noted: Yes Respiratory Status: Room air Behavior/Cognition: Alert;Confused;Doesn't follow directions Oral Cavity Assessment:  (unable to assess) Oral Care Completed by SLP:  (pt refused) Oral Cavity -  Dentition: Edentulous Vision:  (unable to assess) Self-Feeding Abilities: Total assist Patient Positioning: Upright in bed (sidelying; pt refused to lay on back) Baseline Vocal Quality: Normal Volitional Cough: Cognitively unable to elicit    Oral/Motor/Sensory Function Overall Oral Motor/Sensory Function:  (unable to assess; no functional deficits noted)   Ice Chips Ice chips: Within functional limits Presentation: Spoon Other Comments: x5   Thin Liquid Thin Liquid: Within functional limits Presentation: Straw;Spoon Other Comments: ~4 oz; via tsp and straw sips   Puree Puree: Impaired Presentation: Spoon Oral Phase Impairments: Poor awareness of bolus Oral Phase Functional Implications: Prolonged oral transit Other Comments: ~2 oz applesauce   Solid     Solid: Not tested (deferred at this time given AMS)     Jessica Nash, M.S., [facility] [phone] (ASCOM)  Jessica Nash 04/22/2021,11:49 AM

## 2021-04-22 NOTE — Progress Notes (Signed)
Care turned over for patient to this RN.  Patient is refusing oxygen, vital signs, to wear cardiac monitoring.  Patient is very confused and altered in her mentation.  When this RN attempts to place the nasal cannula in her nose or place the cardiac monitor, patient is combative and not cooperative.  This RN notified Dr. Renne Crigler and the Charge RN for the unit Linard Millers RN.  Patient is currently without cardiac monitoring and nasal cannula due to refusal and combative nature of patient.

## 2021-04-23 DIAGNOSIS — J9601 Acute respiratory failure with hypoxia: Secondary | ICD-10-CM | POA: Diagnosis not present

## 2021-04-23 DIAGNOSIS — J9602 Acute respiratory failure with hypercapnia: Secondary | ICD-10-CM | POA: Diagnosis not present

## 2021-04-23 LAB — VITAMIN B12: Vitamin B-12: 566 pg/mL (ref 180–914)

## 2021-04-23 LAB — CBC
HCT: 44.3 % (ref 36.0–46.0)
Hemoglobin: 13.7 g/dL (ref 12.0–15.0)
MCH: 30.9 pg (ref 26.0–34.0)
MCHC: 30.9 g/dL (ref 30.0–36.0)
MCV: 99.8 fL (ref 80.0–100.0)
Platelets: 241 10*3/uL (ref 150–400)
RBC: 4.44 MIL/uL (ref 3.87–5.11)
RDW: 15.2 % (ref 11.5–15.5)
WBC: 6.8 10*3/uL (ref 4.0–10.5)
nRBC: 0 % (ref 0.0–0.2)

## 2021-04-23 LAB — COMPREHENSIVE METABOLIC PANEL
ALT: 15 U/L (ref 0–44)
AST: 26 U/L (ref 15–41)
Albumin: 3.8 g/dL (ref 3.5–5.0)
Alkaline Phosphatase: 43 U/L (ref 38–126)
Anion gap: 8 (ref 5–15)
BUN: 18 mg/dL (ref 8–23)
CO2: 37 mmol/L — ABNORMAL HIGH (ref 22–32)
Calcium: 9.7 mg/dL (ref 8.9–10.3)
Chloride: 97 mmol/L — ABNORMAL LOW (ref 98–111)
Creatinine, Ser: 0.97 mg/dL (ref 0.44–1.00)
GFR, Estimated: 57 mL/min — ABNORMAL LOW (ref >60)
Glucose, Bld: 152 mg/dL — ABNORMAL HIGH (ref 70–99)
Potassium: 3.5 mmol/L (ref 3.5–5.1)
Sodium: 142 mmol/L (ref 135–145)
Total Bilirubin: 0.9 mg/dL (ref 0.3–1.2)
Total Protein: 7.5 g/dL (ref 6.5–8.1)

## 2021-04-23 LAB — GLUCOSE, CAPILLARY
Glucose-Capillary: 110 mg/dL — ABNORMAL HIGH (ref 70–99)
Glucose-Capillary: 116 mg/dL — ABNORMAL HIGH (ref 70–99)
Glucose-Capillary: 119 mg/dL — ABNORMAL HIGH (ref 70–99)
Glucose-Capillary: 163 mg/dL — ABNORMAL HIGH (ref 70–99)
Glucose-Capillary: 197 mg/dL — ABNORMAL HIGH (ref 70–99)
Glucose-Capillary: 233 mg/dL — ABNORMAL HIGH (ref 70–99)

## 2021-04-23 LAB — MAGNESIUM: Magnesium: 2.2 mg/dL (ref 1.7–2.4)

## 2021-04-23 MED ORDER — QUETIAPINE FUMARATE 25 MG PO TABS
25.0000 mg | ORAL_TABLET | Freq: Every day | ORAL | Status: DC
  Administered 2021-04-23 – 2021-04-25 (×3): 25 mg via ORAL
  Filled 2021-04-23 (×3): qty 1

## 2021-04-23 MED ORDER — IPRATROPIUM-ALBUTEROL 0.5-2.5 (3) MG/3ML IN SOLN
3.0000 mL | Freq: Three times a day (TID) | RESPIRATORY_TRACT | Status: DC
Start: 2021-04-23 — End: 2021-04-23

## 2021-04-23 MED ORDER — FUROSEMIDE 40 MG PO TABS
40.0000 mg | ORAL_TABLET | Freq: Every day | ORAL | Status: DC
  Administered 2021-04-23 – 2021-04-26 (×4): 40 mg via ORAL
  Filled 2021-04-23 (×4): qty 1

## 2021-04-23 MED ORDER — DOXYCYCLINE HYCLATE 100 MG PO TABS
100.0000 mg | ORAL_TABLET | Freq: Two times a day (BID) | ORAL | Status: AC
  Administered 2021-04-23 – 2021-04-25 (×6): 100 mg via ORAL
  Filled 2021-04-23 (×6): qty 1

## 2021-04-23 MED ORDER — IPRATROPIUM-ALBUTEROL 0.5-2.5 (3) MG/3ML IN SOLN: 3.0000 mL | Freq: Four times a day (QID) | RESPIRATORY_TRACT | Status: DC | PRN

## 2021-04-23 NOTE — Progress Notes (Signed)
Pt agitated and horizontal in the bed with everything pulled off. Fussing and yelling when RN attempted to reposition her. Pt agreed to allow help after RN repeatedly asked. PRN Haldol admin IM.

## 2021-04-23 NOTE — Plan of Care (Signed)
  Problem: Activity: Goal: Ability to tolerate increased activity will improve 04/23/2021 1119 by Cristela Blue, RN Outcome: Progressing 04/23/2021 1118 by Cristela Blue, RN Outcome: Progressing   Problem: Clinical Measurements: Goal: Ability to maintain a body temperature in the normal range will improve 04/23/2021 1119 by Cristela Blue, RN Outcome: Progressing 04/23/2021 1118 by Cristela Blue, RN Outcome: Progressing   Problem: Respiratory: Goal: Ability to maintain adequate ventilation will improve 04/23/2021 1119 by Cristela Blue, RN Outcome: Progressing 04/23/2021 1118 by Cristela Blue, RN Outcome: Progressing Goal: Ability to maintain a clear airway will improve 04/23/2021 1119 by Cristela Blue, RN Outcome: Progressing 04/23/2021 1118 by Cristela Blue, RN Outcome: Progressing   Problem: Urinary Elimination: Goal: Signs and symptoms of infection will decrease 04/23/2021 1119 by Cristela Blue, RN Outcome: Progressing 04/23/2021 1118 by Cristela Blue, RN Outcome: Progressing   Problem: Education: Goal: Knowledge of General Education information will improve Description: Including pain rating scale, medication(s)/side effects and non-pharmacologic comfort measures 04/23/2021 1119 by Cristela Blue, RN Outcome: Progressing 04/23/2021 1118 by Cristela Blue, RN Outcome: Progressing   Problem: Health Behavior/Discharge Planning: Goal: Ability to manage health-related needs will improve 04/23/2021 1119 by Cristela Blue, RN Outcome: Progressing 04/23/2021 1118 by Cristela Blue, RN Outcome: Progressing   Problem: Clinical Measurements: Goal: Ability to maintain clinical measurements within normal limits will improve 04/23/2021 1119 by Cristela Blue, RN Outcome: Progressing 04/23/2021 1118 by Cristela Blue, RN Outcome: Progressing Goal: Will remain free from infection 04/23/2021 1119 by Cristela Blue, RN Outcome: Progressing 04/23/2021 1118 by  Cristela Blue, RN Outcome: Progressing Goal: Diagnostic test results will improve 04/23/2021 1119 by Cristela Blue, RN Outcome: Progressing 04/23/2021 1118 by Cristela Blue, RN Outcome: Progressing Goal: Respiratory complications will improve 04/23/2021 1119 by Cristela Blue, RN Outcome: Progressing 04/23/2021 1118 by Cristela Blue, RN Outcome: Progressing Goal: Cardiovascular complication will be avoided 04/23/2021 1119 by Cristela Blue, RN Outcome: Progressing 04/23/2021 1118 by Cristela Blue, RN Outcome: Progressing   Problem: Activity: Goal: Risk for activity intolerance will decrease 04/23/2021 1119 by Cristela Blue, RN Outcome: Progressing 04/23/2021 1118 by Cristela Blue, RN Outcome: Progressing   Problem: Nutrition: Goal: Adequate nutrition will be maintained 04/23/2021 1119 by Cristela Blue, RN Outcome: Progressing 04/23/2021 1118 by Cristela Blue, RN Outcome: Progressing   Problem: Coping: Goal: Level of anxiety will decrease 04/23/2021 1119 by Cristela Blue, RN Outcome: Progressing 04/23/2021 1118 by Cristela Blue, RN Outcome: Progressing   Problem: Elimination: Goal: Will not experience complications related to bowel motility 04/23/2021 1119 by Cristela Blue, RN Outcome: Progressing 04/23/2021 1118 by Cristela Blue, RN Outcome: Progressing Goal: Will not experience complications related to urinary retention 04/23/2021 1119 by Cristela Blue, RN Outcome: Progressing 04/23/2021 1118 by Cristela Blue, RN Outcome: Progressing   Problem: Pain Managment: Goal: General experience of comfort will improve 04/23/2021 1119 by Cristela Blue, RN Outcome: Progressing 04/23/2021 1118 by Cristela Blue, RN Outcome: Progressing   Problem: Safety: Goal: Ability to remain free from injury will improve 04/23/2021 1119 by Cristela Blue, RN Outcome: Progressing 04/23/2021 1118 by Cristela Blue, RN Outcome: Progressing   Problem: Skin  Integrity: Goal: Risk for impaired skin integrity will decrease 04/23/2021 1119 by Cristela Blue, RN Outcome: Progressing 04/23/2021 1118 by Cristela Blue, RN Outcome: Progressing

## 2021-04-23 NOTE — Plan of Care (Signed)
?  Problem: Clinical Measurements: ?Goal: Ability to maintain a body temperature in the normal range will improve ?Outcome: Progressing ?  ?Problem: Respiratory: ?Goal: Ability to maintain adequate ventilation will improve ?Outcome: Progressing ?  ?

## 2021-04-23 NOTE — Progress Notes (Signed)
PROGRESS NOTE  Jessica Nash URK:[id number] DOB: June 11, 1932 DOA: 04/20/2021 PCP: Pcp, No   LOS: 3 days   Brief Narrative / Interim history: 87 year old female with chronic respiratory failure on 2 L of oxygen at home, CAD on dual antiplatelet therapy, legally blind, who was brought into the hospital for mental status changes.  History is obtained from daughter over the phone as patient is confused, and daughter tells me that she has been more confused over the last couple of days.  She also mentions that for "quite some time" patient has been having intermittent memory problems with occasional forgetfulness.  She was recently hospitalized about 4 weeks ago in [place] and that is when she was diagnosed with hypoxia and placed on oxygen.  Subjective / 24h Interval events: Remains confused and agitated but overall appears better  Assessment & Plan: Principal Problem Acute on chronic hypoxic and hypercarbic respiratory failure-not sure about underlying etiology, wonder if she has underlying COPD -Continue supplemental oxygen to maintain sats above 90%, ABG looks improved, no longer needs BiPAP -Place on DuoNebs  Active Problems Acute metabolic encephalopathy-based on my discussion with the daughter it is possible that she may have mild underlying dementia, likely worsened by the respiratory failure and hospitalization.  Continue to monitor.  I wonder whether her altered mental status could have some other causes such as a CVA however I am unable to obtain an MRI at this point due to agitation.  Perhaps she is a little bit calmer today  Acute on chronic diastolic CHF-chest x-ray was suggestive of mild pulmonary edema with bilateral pleural effusions, BNP was elevated.  Continue Lasix -2D echo showed normal EF  CAD-daughter reports that she has had stents in the past.  She is on aspirin and Plavix which is now on hold due to concern for dark stools.  Hemoglobin remained stable  Concern for dark  stools-hemoglobin remained stable, no evidence of GI bleed at this point  Possible UTI, possible CAP-based on initial urinalysis as well as chest x-ray, she was initially on IV antibiotics with cultures negative repeat in addition to oral today.    Type 2 diabetes mellitus-continue sliding scale, hold oral agents.  A1c 7.0  History of breast cancer-hold Arimidex until patient's mental status improves  Scheduled Meds:  aspirin EC  81 mg Oral Daily   atorvastatin  10 mg Oral Daily   brimonidine  1 drop Both Eyes BID   And   timolol  1 drop Both Eyes BID   doxycycline  100 mg Oral Q12H   furosemide  40 mg Oral Daily   insulin aspart  0-15 Units Subcutaneous Q4H   isosorbide dinitrate  30 mg Oral Daily   latanoprost  1 drop Both Eyes QHS   metoprolol tartrate  100 mg Oral BID   pantoprazole  40 mg Intravenous Q12H   QUEtiapine  25 mg Oral QHS   sodium chloride flush  3 mL Intravenous Q12H   Continuous Infusions:  sodium chloride Stopped (04/21/21 2303)   PRN Meds:.sodium chloride, acetaminophen **OR** acetaminophen, haloperidol lactate, ipratropium-albuterol, morphine CONCENTRATE, ondansetron **OR** ondansetron (ZOFRAN) IV, sodium chloride flush  Diet Orders (From admission, onward)     Start     Ordered   04/22/21 0953  DIET - DYS 1 Room service appropriate? No; Fluid consistency: Thin  Diet effective now       Question Answer Comment  Room service appropriate? No   Fluid consistency: Thin      04/22/21 0953  DVT prophylaxis: SCDs Start: 04/20/21 1550     Code Status: DNR  Family Communication: Discussed with daughter over the phone  Status is: Inpatient  Remains inpatient appropriate because: confusion  Level of care: Progressive  Consultants:  Palliative  Procedures:  2D echo: pending  Microbiology  none  Antimicrobials: Ceftriaxone     Objective: Vitals:   04/23/21 0312 04/23/21 0405 04/23/21 0807 04/23/21 1116  BP: (!) 148/66  (!)  138/95 (!) 133/92  Pulse: 79  81 62  Resp:   20 20  Temp:   98.3 F (36.8 C) 98.3 F (36.8 C)  TempSrc:      SpO2: 95%  100% 99%  Weight:  87.6 kg    Height:        Intake/Output Summary (Last 24 hours) at 04/23/2021 1147 Last data filed at 04/22/2021 2218 Gross per 24 hour  Intake 600 ml  Output 0 ml  Net 600 ml    Filed Weights   04/22/21 1702 04/23/21 0405  Weight: 87.6 kg 87.6 kg    Examination:  Constitutional: Agitated Eyes: Anicteric ENMT: mmm Neck: normal, supple Respiratory: Clear bilaterally Cardiovascular: Regular rate and rhythm, no murmurs, no edema Abdomen: Soft, nontender, nondistended, bowel sounds positive Musculoskeletal: no clubbing / cyanosis.  Skin: No rashes seen Neurologic: agitated, no focal deficits  Data Reviewed: I have independently reviewed following labs and imaging studies  CBC: Recent Labs  Lab 04/20/21 1155 04/21/21 0805 04/22/21 0504 04/23/21 0928  WBC 7.4 5.1 6.8 6.8  HGB 13.5 13.5 13.0 13.7  HCT 45.4 45.6 41.8 44.3  MCV 106.6* 104.6* 102.5* 99.8  PLT 258 212 241 888    Basic Metabolic Panel: Recent Labs  Lab 04/20/21 1155 04/21/21 0805 04/22/21 0504 04/23/21 0928  NA 140 144 143 142  K 4.7 4.3 4.2 3.5  CL 97* 100 97* 97*  CO2 34* 36* 36* 37*  GLUCOSE 179* 102* 104* 152*  BUN 30* 28* 24* 18  CREATININE 1.34* 1.38* 1.06* 0.97  CALCIUM 9.2 9.3 9.4 9.7  MG  --   --   --  2.2    Liver Function Tests: Recent Labs  Lab 04/20/21 1155 04/21/21 0805 04/22/21 0504 04/23/21 0928  AST 19 18 23 26   ALT 12 12 12 15   ALKPHOS 51 47 43 43  BILITOT 0.7 0.8 0.9 0.9  PROT 7.6 7.5 7.5 7.5  ALBUMIN 3.8 3.7 3.7 3.8    Coagulation Profile: Recent Labs  Lab 04/20/21 1155  INR 1.2    HbA1C: Recent Labs    04/20/21 1814  HGBA1C 7.0*    CBG: Recent Labs  Lab 04/22/21 2033 04/23/21 0048 04/23/21 0306 04/23/21 0809 04/23/21 1118  GLUCAP 203* 119* 116* 110* 233*     Recent Results (from the past 240  hour(s))  Blood culture (single)     Status: None (Preliminary result)   Collection Time: 04/20/21 11:50 AM   Specimen: BLOOD  Result Value Ref Range Status   Specimen Description BLOOD BLOOD RIGHT FOREARM  Final   Special Requests   Final    BOTTLES DRAWN AEROBIC AND ANAEROBIC Blood Culture adequate volume   Culture   Final    NO GROWTH 3 DAYS Performed at [facility], [address] [place], [place] [address]    Report Status PENDING  Incomplete  Resp Panel by RT-PCR (Flu A&B, Covid) Nasopharyngeal Swab     Status: None   Collection Time: 04/20/21 11:55 AM   Specimen: Nasopharyngeal Swab; Nasopharyngeal(NP)  swabs in vial transport medium  Result Value Ref Range Status   SARS Coronavirus 2 by RT PCR NEGATIVE NEGATIVE Final    Comment: (NOTE) SARS-CoV-2 target nucleic acids are NOT DETECTED.  The SARS-CoV-2 RNA is generally detectable in upper respiratory specimens during the acute phase of infection. The lowest concentration of SARS-CoV-2 viral copies this assay can detect is 138 copies/mL. A negative result does not preclude SARS-Cov-2 infection and should not be used as the sole basis for treatment or other patient management decisions. A negative result may occur with  improper specimen collection/handling, submission of specimen other than nasopharyngeal swab, presence of viral mutation(s) within the areas targeted by this assay, and inadequate number of viral copies(<138 copies/mL). A negative result must be combined with clinical observations, patient history, and epidemiological information. The expected result is Negative.  Fact Sheet for Patients:  EntrepreneurPulse.com.au  Fact Sheet for Healthcare Providers:  IncredibleEmployment.be  This test is no t yet approved or cleared by the [place] FDA and  has been authorized for detection and/or diagnosis of SARS-CoV-2 by FDA under an Emergency Use Authorization  (EUA). This EUA will remain  in effect (meaning this test can be used) for the duration of the COVID-19 declaration under Section 564(b)(1) of the Act, 21 U.S.C.section 360bbb-3(b)(1), unless the authorization is terminated  or revoked sooner.       Influenza A by PCR NEGATIVE NEGATIVE Final   Influenza B by PCR NEGATIVE NEGATIVE Final    Comment: (NOTE) The Xpert Xpress SARS-CoV-2/FLU/RSV plus assay is intended as an aid in the diagnosis of influenza from Nasopharyngeal swab specimens and should not be used as a sole basis for treatment. Nasal washings and aspirates are unacceptable for Xpert Xpress SARS-CoV-2/FLU/RSV testing.  Fact Sheet for Patients: EntrepreneurPulse.com.au  Fact Sheet for Healthcare Providers: IncredibleEmployment.be  This test is not yet approved or cleared by the [place] FDA and has been authorized for detection and/or diagnosis of SARS-CoV-2 by FDA under an Emergency Use Authorization (EUA). This EUA will remain in effect (meaning this test can be used) for the duration of the COVID-19 declaration under Section 564(b)(1) of the Act, 21 U.S.C. section 360bbb-3(b)(1), unless the authorization is terminated or revoked.  Performed at [facility], [address] [place], [place] [address]   Respiratory (~20 pathogens) panel by PCR     Status: None   Collection Time: 04/21/21  2:58 PM   Specimen: Nasopharyngeal Swab; Respiratory  Result Value Ref Range Status   Adenovirus NOT DETECTED NOT DETECTED Final   Coronavirus 229E NOT DETECTED NOT DETECTED Final    Comment: (NOTE) The Coronavirus on the Respiratory Panel, DOES NOT test for the novel  Coronavirus (2019 nCoV)    Coronavirus HKU1 NOT DETECTED NOT DETECTED Final   Coronavirus NL63 NOT DETECTED NOT DETECTED Final   Coronavirus OC43 NOT DETECTED NOT DETECTED Final   Metapneumovirus NOT DETECTED NOT DETECTED Final   Rhinovirus / Enterovirus NOT  DETECTED NOT DETECTED Final   Influenza A NOT DETECTED NOT DETECTED Final   Influenza B NOT DETECTED NOT DETECTED Final   Parainfluenza Virus 1 NOT DETECTED NOT DETECTED Final   Parainfluenza Virus 2 NOT DETECTED NOT DETECTED Final   Parainfluenza Virus 3 NOT DETECTED NOT DETECTED Final   Parainfluenza Virus 4 NOT DETECTED NOT DETECTED Final   Respiratory Syncytial Virus NOT DETECTED NOT DETECTED Final   Bordetella pertussis NOT DETECTED NOT DETECTED Final   Bordetella Parapertussis NOT DETECTED NOT DETECTED Final  Chlamydophila pneumoniae NOT DETECTED NOT DETECTED Final   Mycoplasma pneumoniae NOT DETECTED NOT DETECTED Final    Comment: Performed at [facility], [place] [address] [place], [place] [address]      Radiology Studies: No results found.   Marzetta Board, MD, PhD [facility]  Between 7 am - 7 pm I am available, please contact me via Amion (for emergencies) or Securechat (non urgent messages)  Between 7 pm - 7 am I am not available, please contact night coverage MD/APP via Amion

## 2021-04-23 NOTE — Evaluation (Signed)
Physical Therapy Evaluation Patient Details Name: Jessica Nash MRN: [id number] DOB: 1932-12-24 Today's Date: 04/23/2021  History of Present Illness  Pt is an 87 y/o F admitted on 04/20/21 after being brought to the hospital for change in mental status. Pt is being treated for acute on chronic hypoxic & hypercarbic respiratory failure of undetermined etiology. PMH: chronic respiratory failure on 2L O2 at home, CAD, legally blind, breast CA, DDD, GERD, decompressive lumbar laminectomy, HLD, HTN, MI  Clinical Impression  Pt seen for PT evaluation with daughter Jamse [place]) present for session. Pt lives with her other daughter in [place], but recently moved from [place]. Pt is legally blind & would ambulate with assistance from daughter 2/2 visual deficits, otherwise, pt independent without AD. On this date pt is oriented to self & location only but is able to engage in conversation with PT. Pt completes bed mobility with min assist & transfers STS & stand pivot to recliner with min assist. PT provides total assist for peri hygiene & encourages pt to ambulate but pt states she doesn't feel like she can do that at this time. Will continue to follow pt acutely to progress gait as able.   SpO2 90% (on 3L/min), HR 105 bpm after transferring to recliner       Recommendations for follow up therapy are one component of a multi-disciplinary discharge planning process, led by the attending physician.  Recommendations may be updated based on patient status, additional functional criteria and insurance authorization.  Follow Up Recommendations Home health PT    Assistance Recommended at Discharge Frequent or constant Supervision/Assistance  Functional Status Assessment Patient has had a recent decline in their functional status and demonstrates the ability to make significant improvements in function in a reasonable and predictable amount of time.  Equipment Recommendations  BSC/3in1    Recommendations for Other Services        Precautions / Restrictions Precautions Precautions: Fall Precaution Comments: legally blind Restrictions Weight Bearing Restrictions: No      Mobility  Bed Mobility Overal bed mobility: Needs Assistance Bed Mobility: Rolling;Sidelying to Sit Rolling: Min guard Sidelying to sit: Min assist       General bed mobility comments: verbal/tactile cuing to initiate & complete    Transfers Overall transfer level: Needs assistance Equipment used: None Transfers: Sit to/from Stand;Bed to chair/wheelchair/BSC Sit to Stand: Min assist Stand pivot transfers: Min assist         General transfer comment: verbal/tactile cuing for placement to ensure pt knows where recliner is    Ambulation/Gait Ambulation/Gait assistance:  (pt declines despite PT attempts)                Stairs            Wheelchair Mobility    Modified Rankin (Stroke Patients Only)       Balance Overall balance assessment: Needs assistance Sitting-balance support: Feet supported;Bilateral upper extremity supported Sitting balance-Leahy Scale: Fair Sitting balance - Comments: close supervision static sitting   Standing balance support: Single extremity supported;During functional activity Standing balance-Leahy Scale: Fair Standing balance comment: Pt able to stand with 1UE support to allow PT to perform peri hygiene.                             Pertinent Vitals/Pain Pain Assessment: Faces Faces Pain Scale: Hurts a little bit Pain Location: c/o generalized total body aching Pain Descriptors / Indicators: Aching Pain Intervention(s): Limited activity within patient's  tolerance;Monitored during session;Repositioned (notified RN)    Home Living Family/patient expects to be discharged to:: Private residence Living Arrangements: Children Available Help at Discharge: Family;Available 24 hours/day Type of Home: House Home Access: Stairs to enter Entrance Stairs-Rails:  None Entrance Stairs-Number of Steps: 1 Alternate Level Stairs-Number of Steps: flight Home Layout: Two level;Bed/bath upstairs        Prior Function               Mobility Comments: Pt recently moved from [place] to live with her daughter in [place]. Pt is legally blind & daughter would provide supervision/HHA for gait in the home, otherwise pt did not use AD & has had no recent falls.       Hand Dominance        Extremity/Trunk Assessment   Upper Extremity Assessment Upper Extremity Assessment: Generalized weakness    Lower Extremity Assessment Lower Extremity Assessment: Generalized weakness       Communication      Cognition Arousal/Alertness: Lethargic Behavior During Therapy: Flat affect Overall Cognitive Status: Impaired/Different from baseline Area of Impairment: Orientation;Attention;Memory;Following commands;Awareness;Safety/judgement;Problem solving                 Orientation Level: Disoriented to;Time;Situation     Following Commands: Follows one step commands consistently;Follows multi-step commands with increased time Safety/Judgement: Decreased awareness of safety;Decreased awareness of deficits Awareness: Anticipatory;Emergent;Intellectual   General Comments: PT provides verbal/tactile cuing throughout session 2/2 pt is legally blind.        General Comments General comments (skin integrity, edema, etc.): Pt noted to have smear BM with PT providing total assist for peri hygiene & assisting with changing into a clean gown. Pt on 3L/min via nasal cannula with pt removing cannula at one time with daughter assisting with donning it properly.    Exercises     Assessment/Plan    PT Assessment Patient needs continued PT services  PT Problem List Decreased strength;Decreased mobility;Decreased safety awareness;Decreased balance;Decreased knowledge of use of DME;Decreased activity tolerance;Cardiopulmonary status limiting activity       PT  Treatment Interventions DME instruction;Therapeutic activities;Cognitive remediation;Modalities;Gait training;Therapeutic exercise;Patient/family education;Stair training;Balance training;Functional mobility training;Neuromuscular re-education;Manual techniques    PT Goals (Current goals can be found in the Care Plan section)  Acute Rehab PT Goals Patient Stated Goal: go home, get better PT Goal Formulation: With patient/family Time For Goal Achievement: 05/07/21 Potential to Achieve Goals: Good    Frequency Min 2X/week   Barriers to discharge        Co-evaluation               AM-PAC PT "6 Clicks" Mobility  Outcome Measure Help needed turning from your back to your side while in a flat bed without using bedrails?: A Little Help needed moving from lying on your back to sitting on the side of a flat bed without using bedrails?: A Little Help needed moving to and from a bed to a chair (including a wheelchair)?: A Little Help needed standing up from a chair using your arms (e.g., wheelchair or bedside chair)?: A Little Help needed to walk in hospital room?: A Lot Help needed climbing 3-5 steps with a railing? : Total 6 Click Score: 15    End of Session Equipment Utilized During Treatment: Oxygen Activity Tolerance: Patient tolerated treatment well Patient left: in chair;with chair alarm set;with call bell/phone within reach;with family/visitor present Nurse Communication: Mobility status PT Visit Diagnosis: Muscle weakness (generalized) (M62.81);Unsteadiness on feet (R26.81);Difficulty in walking, not elsewhere classified (  R26.2)    Time: 2426-8341 PT Time Calculation (min) (ACUTE ONLY): 17 min   Charges:   PT Evaluation $PT Eval Moderate Complexity: 1 Mod PT Treatments $Therapeutic Activity: 8-22 mins        Lavone Nian, PT, DPT 04/23/21, 2:57 PM   Waunita Schooner 04/23/2021, 2:55 PM

## 2021-04-23 NOTE — Progress Notes (Signed)
Late entry for 04/22/2021 2200  04/22/21 2015  Assess: MEWS Score  Temp 99.1 F (37.3 C)  BP (!) 165/82  Pulse Rate (!) 122  Resp (!) 30  Level of Consciousness Alert  SpO2 97 %  O2 Device Nasal Cannula  O2 Flow Rate (L/min) 3 L/min  Assess: MEWS Score  MEWS Temp 0  MEWS Systolic 0  MEWS Pulse 2  MEWS RR 2  MEWS LOC 0  MEWS Score 4  MEWS Score Color Red  Assess: if the MEWS score is Yellow or Red  Were vital signs taken at a resting state? No  Focused Assessment No change from prior assessment  Does the patient meet 2 or more of the SIRS criteria? No  Does the patient have a confirmed or suspected source of infection? No  MEWS guidelines implemented *See Row Information* No, vital signs rechecked  Treat  MEWS Interventions Administered scheduled meds/treatments;Administered prn meds/treatments  Pain Scale 0-10  Pain Score 0  Patients response to intervention Relief  Assess: SIRS CRITERIA  SIRS Temperature  0  SIRS Pulse 1  SIRS Respirations  1  SIRS WBC 0  SIRS Score Sum  2

## 2021-04-24 ENCOUNTER — Inpatient Hospital Stay: Payer: Medicare Other

## 2021-04-24 DIAGNOSIS — J9601 Acute respiratory failure with hypoxia: Secondary | ICD-10-CM | POA: Diagnosis not present

## 2021-04-24 DIAGNOSIS — J9602 Acute respiratory failure with hypercapnia: Secondary | ICD-10-CM | POA: Diagnosis not present

## 2021-04-24 LAB — COMPREHENSIVE METABOLIC PANEL
ALT: 15 U/L (ref 0–44)
AST: 24 U/L (ref 15–41)
Albumin: 3.8 g/dL (ref 3.5–5.0)
Alkaline Phosphatase: 42 U/L (ref 38–126)
Anion gap: 7 (ref 5–15)
BUN: 21 mg/dL (ref 8–23)
CO2: 37 mmol/L — ABNORMAL HIGH (ref 22–32)
Calcium: 9.6 mg/dL (ref 8.9–10.3)
Chloride: 97 mmol/L — ABNORMAL LOW (ref 98–111)
Creatinine, Ser: 1.06 mg/dL — ABNORMAL HIGH (ref 0.44–1.00)
GFR, Estimated: 51 mL/min — ABNORMAL LOW (ref >60)
Glucose, Bld: 142 mg/dL — ABNORMAL HIGH (ref 70–99)
Potassium: 3.7 mmol/L (ref 3.5–5.1)
Sodium: 141 mmol/L (ref 135–145)
Total Bilirubin: 1 mg/dL (ref 0.3–1.2)
Total Protein: 7.4 g/dL (ref 6.5–8.1)

## 2021-04-24 LAB — CBC
HCT: 45.1 % (ref 36.0–46.0)
Hemoglobin: 14.2 g/dL (ref 12.0–15.0)
MCH: 31.2 pg (ref 26.0–34.0)
MCHC: 31.5 g/dL (ref 30.0–36.0)
MCV: 99.1 fL (ref 80.0–100.0)
Platelets: 239 10*3/uL (ref 150–400)
RBC: 4.55 MIL/uL (ref 3.87–5.11)
RDW: 14.6 % (ref 11.5–15.5)
WBC: 6.1 10*3/uL (ref 4.0–10.5)
nRBC: 0 % (ref 0.0–0.2)

## 2021-04-24 LAB — GLUCOSE, CAPILLARY
Glucose-Capillary: 130 mg/dL — ABNORMAL HIGH (ref 70–99)
Glucose-Capillary: 136 mg/dL — ABNORMAL HIGH (ref 70–99)
Glucose-Capillary: 153 mg/dL — ABNORMAL HIGH (ref 70–99)
Glucose-Capillary: 170 mg/dL — ABNORMAL HIGH (ref 70–99)
Glucose-Capillary: 171 mg/dL — ABNORMAL HIGH (ref 70–99)
Glucose-Capillary: 190 mg/dL — ABNORMAL HIGH (ref 70–99)

## 2021-04-24 LAB — RPR: RPR Ser Ql: NONREACTIVE

## 2021-04-24 MED ORDER — PANTOPRAZOLE SODIUM 40 MG PO TBEC
40.0000 mg | DELAYED_RELEASE_TABLET | Freq: Two times a day (BID) | ORAL | Status: DC
  Administered 2021-04-24 – 2021-04-26 (×4): 40 mg via ORAL
  Filled 2021-04-24 (×4): qty 1

## 2021-04-24 NOTE — Progress Notes (Signed)
PHARMACIST - PHYSICIAN COMMUNICATION  DR:   Cruzita Lederer  CONCERNING: IV to Oral Route Change Policy  RECOMMENDATION: This patient is receiving pantoprazole by the intravenous route.  Based on criteria approved by the Pharmacy and Therapeutics Committee, the intravenous medication(s) is/are being converted to the equivalent oral dose form(s).   DESCRIPTION: These criteria include: The patient is eating (either orally or via tube) and/or has been taking other orally administered medications for a least 24 hours The patient has no evidence of active gastrointestinal bleeding or impaired GI absorption (gastrectomy, short bowel, patient on TNA or NPO).  If you have questions about this conversion, please contact the Pharmacy Department  []   [phone] )  [facility] [x]   [phone] )  [facility] []   [phone] )  [facility] []   [phone] )  [facility] []   [phone] )  [place], [facility] 04/24/2021 7:32 PM

## 2021-04-24 NOTE — TOC Progression Note (Signed)
Transition of Care [facility]) - Progression Note    Patient Details  Name: Jessica Nash MRN: [id number] Date of Birth: [birth year]  Transition of Care [facility]) CM/SW [place], LCSW Phone Number: 04/24/2021, 11:27 AM  Clinical Narrative:   Per notes TOC has set up patient with [facility] and patient needs PCP. Patient is disoriented. CSW attempted call to daughter Andris Flurry to discuss PCP resources. No answer and no VM option.    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

## 2021-04-24 NOTE — Progress Notes (Signed)
SLP Note  Patient Details Name: Jessica Nash MRN: [id number] DOB: [birth year]  Spoke with RN, Vickie. Per RN, pt continues with waxing/waning mental status. RN reports pt tolerating pureed diet with thin liquids without overt s/sx pharyngeal dysphagia; however, not   appropriate for clinical swallowing re-evaluation for possible upgrade given waxing/waning mental status. Per chart review, WBC and temp WNL. No recent chest imaging.   Recommend continuation of pureed diet with thin liquids and safe swallowing strategies/aspiration precautions as outlined in SLP note.   Pt will continue to monitor for appropriateness for re-evaluation pending improvement in mental status.  Cherrie Gauze, M.S., [facility] [phone] ([place])  Quintella Baton 04/24/2021, 2:09 PM

## 2021-04-24 NOTE — Progress Notes (Signed)
PROGRESS NOTE  Jessica Nash GYJ:[id number] DOB: Mar 07, 1933 DOA: 04/20/2021 PCP: Pcp, No   LOS: 4 days   Brief Narrative / Interim history: 87 year old female with chronic respiratory failure on 2 L of oxygen at home, CAD on dual antiplatelet therapy, legally blind, who was brought into the hospital for mental status changes.  History is obtained from daughter over the phone as patient is confused, and daughter tells me that she has been more confused over the last couple of days.  She also mentions that for "quite some time" patient has been having intermittent memory problems with occasional forgetfulness.  She was recently hospitalized about 4 weeks ago in [place] and that is when she was diagnosed with hypoxia and placed on oxygen.  Subjective / 24h Interval events: Somewhat better, less agitated.  Complains of abdominal pain this morning  Assessment & Plan: Principal Problem Acute on chronic hypoxic and hypercarbic respiratory failure-not sure about underlying etiology, wonder if she has underlying COPD -Continue supplemental oxygen to maintain sats above 90%, ABG looks improved, no longer needs BiPAP -Continue DuoNebs  Active Problems Acute metabolic encephalopathy-based on my discussion with the daughter it is possible that she may have mild underlying dementia, likely worsened by the respiratory failure and hospitalization.  Continue to monitor.  I wonder whether her altered mental status could have some other causes such as a CVA however I am unable to obtain an MRI at this point due to agitation.  If she tolerates a CT scan of the abdomen as below we will attempt an MRI of the brain  Abdominal pain-obtain a CT scan of the abdomen as she cannot elaborate this further.  Acute on chronic diastolic CHF-chest x-ray was suggestive of mild pulmonary edema with bilateral pleural effusions, BNP was elevated.  Continue Lasix -2D echo showed normal EF  CAD-daughter reports that she has had  stents in the past.  She is on aspirin and Plavix which is now on hold due to concern for dark stools.  Hemoglobin remained stable  Concern for dark stools-hemoglobin remained stable, no evidence of GI bleed at this point.  CT scan as above  Possible UTI, possible CAP-on oral antibiotics  Type 2 diabetes mellitus-continue sliding scale, hold oral agents.  A1c 7.0  History of breast cancer-hold Arimidex until patient's mental status improves  Scheduled Meds:  aspirin EC  81 mg Oral Daily   atorvastatin  10 mg Oral Daily   brimonidine  1 drop Both Eyes BID   And   timolol  1 drop Both Eyes BID   doxycycline  100 mg Oral Q12H   furosemide  40 mg Oral Daily   insulin aspart  0-15 Units Subcutaneous Q4H   isosorbide dinitrate  30 mg Oral Daily   latanoprost  1 drop Both Eyes QHS   metoprolol tartrate  100 mg Oral BID   pantoprazole  40 mg Intravenous Q12H   QUEtiapine  25 mg Oral QHS   sodium chloride flush  3 mL Intravenous Q12H   Continuous Infusions:  sodium chloride Stopped (04/21/21 2303)   PRN Meds:.sodium chloride, acetaminophen **OR** acetaminophen, haloperidol lactate, ipratropium-albuterol, morphine CONCENTRATE, ondansetron **OR** ondansetron (ZOFRAN) IV, sodium chloride flush  Diet Orders (From admission, onward)     Start     Ordered   04/22/21 0953  DIET - DYS 1 Room service appropriate? No; Fluid consistency: Thin  Diet effective now       Question Answer Comment  Room service appropriate? No  Fluid consistency: Thin      04/22/21 0953            DVT prophylaxis: SCDs Start: 04/20/21 1550     Code Status: DNR  Family Communication: Daughter at bedside  Status is: Inpatient  Remains inpatient appropriate because: confusion  Level of care: Progressive  Consultants:  Palliative  Procedures:  2D echo: pending  Microbiology  none  Antimicrobials: Ceftriaxone     Objective: Vitals:   04/23/21 1537 04/23/21 1847 04/24/21 0605 04/24/21 0726   BP: (!) 151/89 139/86 (!) 145/70 (!) 128/98  Pulse: 62 86 69 80  Resp: 20 20 18 20   Temp: 98.6 F (37 C) 97.9 F (36.6 C) 98.4 F (36.9 C) 99.5 F (37.5 C)  TempSrc:    Oral  SpO2: 98% 90% (!) 89% 99%  Weight:      Height:        Intake/Output Summary (Last 24 hours) at 04/24/2021 1046 Last data filed at 04/24/2021 1019 Gross per 24 hour  Intake 0 ml  Output 700 ml  Net -700 ml    Filed Weights   04/22/21 1702 04/23/21 0405  Weight: 87.6 kg 87.6 kg    Examination:  Constitutional: No distress, in bed, eyes are closed ENMT: mmm Neck: normal, supple Respiratory: Clear to auscultation bilaterally, no wheezing Cardiovascular: Regular rate and rhythm, no murmurs, no edema Abdomen: Soft, mild tenderness to palpation, bowel sounds positive Musculoskeletal: no clubbing / cyanosis.  Skin: No rashes appreciated Neurologic: No focal deficits  Data Reviewed: I have independently reviewed following labs and imaging studies  CBC: Recent Labs  Lab 04/20/21 1155 04/21/21 0805 04/22/21 0504 04/23/21 0928 04/24/21 0555  WBC 7.4 5.1 6.8 6.8 6.1  HGB 13.5 13.5 13.0 13.7 14.2  HCT 45.4 45.6 41.8 44.3 45.1  MCV 106.6* 104.6* 102.5* 99.8 99.1  PLT 258 212 241 241 657    Basic Metabolic Panel: Recent Labs  Lab 04/20/21 1155 04/21/21 0805 04/22/21 0504 04/23/21 0928 04/24/21 0555  NA 140 144 143 142 141  K 4.7 4.3 4.2 3.5 3.7  CL 97* 100 97* 97* 97*  CO2 34* 36* 36* 37* 37*  GLUCOSE 179* 102* 104* 152* 142*  BUN 30* 28* 24* 18 21  CREATININE 1.34* 1.38* 1.06* 0.97 1.06*  CALCIUM 9.2 9.3 9.4 9.7 9.6  MG  --   --   --  2.2  --     Liver Function Tests: Recent Labs  Lab 04/20/21 1155 04/21/21 0805 04/22/21 0504 04/23/21 0928 04/24/21 0555  AST 19 18 23 26 24   ALT 12 12 12 15 15   ALKPHOS 51 47 43 43 42  BILITOT 0.7 0.8 0.9 0.9 1.0  PROT 7.6 7.5 7.5 7.5 7.4  ALBUMIN 3.8 3.7 3.7 3.8 3.8    Coagulation Profile: Recent Labs  Lab 04/20/21 1155  INR 1.2     HbA1C: No results for input(s): HGBA1C in the last 72 hours.  CBG: Recent Labs  Lab 04/23/21 1118 04/23/21 1538 04/23/21 1943 04/24/21 0601 04/24/21 0728  GLUCAP 233* 197* 163* 153* 130*     Recent Results (from the past 240 hour(s))  Blood culture (single)     Status: None (Preliminary result)   Collection Time: 04/20/21 11:50 AM   Specimen: BLOOD  Result Value Ref Range Status   Specimen Description BLOOD BLOOD RIGHT FOREARM  Final   Special Requests   Final    BOTTLES DRAWN AEROBIC AND ANAEROBIC Blood Culture adequate volume   Culture  Final    NO GROWTH 4 DAYS Performed at [facility], [address] [place], [place] [address]    Report Status PENDING  Incomplete  Resp Panel by RT-PCR (Flu A&B, Covid) Nasopharyngeal Swab     Status: None   Collection Time: 04/20/21 11:55 AM   Specimen: Nasopharyngeal Swab; Nasopharyngeal(NP) swabs in vial transport medium  Result Value Ref Range Status   SARS Coronavirus 2 by RT PCR NEGATIVE NEGATIVE Final    Comment: (NOTE) SARS-CoV-2 target nucleic acids are NOT DETECTED.  The SARS-CoV-2 RNA is generally detectable in upper respiratory specimens during the acute phase of infection. The lowest concentration of SARS-CoV-2 viral copies this assay can detect is 138 copies/mL. A negative result does not preclude SARS-Cov-2 infection and should not be used as the sole basis for treatment or other patient management decisions. A negative result may occur with  improper specimen collection/handling, submission of specimen other than nasopharyngeal swab, presence of viral mutation(s) within the areas targeted by this assay, and inadequate number of viral copies(<138 copies/mL). A negative result must be combined with clinical observations, patient history, and epidemiological information. The expected result is Negative.  Fact Sheet for Patients:  EntrepreneurPulse.com.au  Fact Sheet for Healthcare  Providers:  IncredibleEmployment.be  This test is no t yet approved or cleared by the [place] FDA and  has been authorized for detection and/or diagnosis of SARS-CoV-2 by FDA under an Emergency Use Authorization (EUA). This EUA will remain  in effect (meaning this test can be used) for the duration of the COVID-19 declaration under Section 564(b)(1) of the Act, 21 U.S.C.section 360bbb-3(b)(1), unless the authorization is terminated  or revoked sooner.       Influenza A by PCR NEGATIVE NEGATIVE Final   Influenza B by PCR NEGATIVE NEGATIVE Final    Comment: (NOTE) The Xpert Xpress SARS-CoV-2/FLU/RSV plus assay is intended as an aid in the diagnosis of influenza from Nasopharyngeal swab specimens and should not be used as a sole basis for treatment. Nasal washings and aspirates are unacceptable for Xpert Xpress SARS-CoV-2/FLU/RSV testing.  Fact Sheet for Patients: EntrepreneurPulse.com.au  Fact Sheet for Healthcare Providers: IncredibleEmployment.be  This test is not yet approved or cleared by the [place] FDA and has been authorized for detection and/or diagnosis of SARS-CoV-2 by FDA under an Emergency Use Authorization (EUA). This EUA will remain in effect (meaning this test can be used) for the duration of the COVID-19 declaration under Section 564(b)(1) of the Act, 21 U.S.C. section 360bbb-3(b)(1), unless the authorization is terminated or revoked.  Performed at [facility], [place], [place] [address]   Respiratory (~20 pathogens) panel by PCR     Status: None   Collection Time: 04/21/21  2:58 PM   Specimen: Nasopharyngeal Swab; Respiratory  Result Value Ref Range Status   Adenovirus NOT DETECTED NOT DETECTED Final   Coronavirus 229E NOT DETECTED NOT DETECTED Final    Comment: (NOTE) The Coronavirus on the Respiratory Panel, DOES NOT test for the novel  Coronavirus (2019 nCoV)     Coronavirus HKU1 NOT DETECTED NOT DETECTED Final   Coronavirus NL63 NOT DETECTED NOT DETECTED Final   Coronavirus OC43 NOT DETECTED NOT DETECTED Final   Metapneumovirus NOT DETECTED NOT DETECTED Final   Rhinovirus / Enterovirus NOT DETECTED NOT DETECTED Final   Influenza A NOT DETECTED NOT DETECTED Final   Influenza B NOT DETECTED NOT DETECTED Final   Parainfluenza Virus 1 NOT DETECTED NOT DETECTED Final  Parainfluenza Virus 2 NOT DETECTED NOT DETECTED Final   Parainfluenza Virus 3 NOT DETECTED NOT DETECTED Final   Parainfluenza Virus 4 NOT DETECTED NOT DETECTED Final   Respiratory Syncytial Virus NOT DETECTED NOT DETECTED Final   Bordetella pertussis NOT DETECTED NOT DETECTED Final   Bordetella Parapertussis NOT DETECTED NOT DETECTED Final   Chlamydophila pneumoniae NOT DETECTED NOT DETECTED Final   Mycoplasma pneumoniae NOT DETECTED NOT DETECTED Final    Comment: Performed at [facility], [place] [address] [place], [place] [address]      Radiology Studies: No results found.   Marzetta Board, MD, PhD [facility]  Between 7 am - 7 pm I am available, please contact me via Amion (for emergencies) or Securechat (non urgent messages)  Between 7 pm - 7 am I am not available, please contact night coverage MD/APP via Amion

## 2021-04-25 DIAGNOSIS — J9601 Acute respiratory failure with hypoxia: Secondary | ICD-10-CM | POA: Diagnosis not present

## 2021-04-25 DIAGNOSIS — J9602 Acute respiratory failure with hypercapnia: Secondary | ICD-10-CM | POA: Diagnosis not present

## 2021-04-25 LAB — CULTURE, BLOOD (SINGLE)
Culture: NO GROWTH
Special Requests: ADEQUATE

## 2021-04-25 LAB — GLUCOSE, CAPILLARY
Glucose-Capillary: 127 mg/dL — ABNORMAL HIGH (ref 70–99)
Glucose-Capillary: 127 mg/dL — ABNORMAL HIGH (ref 70–99)
Glucose-Capillary: 258 mg/dL — ABNORMAL HIGH (ref 70–99)
Glucose-Capillary: 174 mg/dL — ABNORMAL HIGH (ref 70–99)
Glucose-Capillary: 178 mg/dL — ABNORMAL HIGH (ref 70–99)

## 2021-04-25 MED ORDER — CLOPIDOGREL BISULFATE 75 MG PO TABS
75.0000 mg | ORAL_TABLET | Freq: Every day | ORAL | Status: DC
  Administered 2021-04-25 – 2021-04-26 (×2): 75 mg via ORAL
  Filled 2021-04-25 (×2): qty 1

## 2021-04-25 MED ORDER — GABAPENTIN 100 MG PO CAPS
100.0000 mg | ORAL_CAPSULE | Freq: Three times a day (TID) | ORAL | Status: DC
  Administered 2021-04-25: 12:00:00 100 mg via ORAL
  Filled 2021-04-25: qty 1

## 2021-04-25 MED ORDER — GABAPENTIN 100 MG PO CAPS
100.0000 mg | ORAL_CAPSULE | Freq: Two times a day (BID) | ORAL | Status: DC
  Administered 2021-04-25 – 2021-04-26 (×2): 100 mg via ORAL
  Filled 2021-04-25 (×2): qty 1

## 2021-04-25 NOTE — Progress Notes (Signed)
Physical Therapy Treatment Patient Details Name: Jessica Nash MRN: [id number] DOB: [birth year] Today's Date: 04/25/2021   History of Present Illness Pt is an 87 y/o F admitted on 04/20/21 after being brought to the hospital for change in mental status. Pt is being treated for acute on chronic hypoxic & hypercarbic respiratory failure of undetermined etiology. PMH: chronic respiratory failure on 2L O2 at home, CAD, legally blind, breast CA, DDD, GERD, decompressive lumbar laminectomy, HLD, HTN, MI    PT Comments    Pt seen for PT tx with pt received in bed on room air & SpO2 78%. PT placed pt on nasal cannula with pt requiring up to 4L/min to increase SpO2 to 89-90% with cuing to close mouth & breathe through nose. Nurse notified & came to room to assess pt & giving clearance to get pt to recliner. Pt requires min assist to complete bed mobility & found to be incontinent of urine. Provided total assist for changing gowns. Pt is able to complete bed>recliner with HHA & min assist. Pt requires encouragement to attempt ambulation & hold drink cup herself during session. Pt is able to ambulate a max of 3 ft x 2 with 1UE HHA & min assist; after first gait trial pt reports SOB but SpO2 >/= 90%. Pt left in recliner in care of nursing staff.    Recommendations for follow up therapy are one component of a multi-disciplinary discharge planning process, led by the attending physician.  Recommendations may be updated based on patient status, additional functional criteria and insurance authorization.  Follow Up Recommendations  Home health PT     Assistance Recommended at Discharge Frequent or constant Supervision/Assistance  Equipment Recommendations  BSC/3in1    Recommendations for Other Services       Precautions / Restrictions Precautions Precautions: Fall Precaution Comments: legally blind Restrictions Weight Bearing Restrictions: No     Mobility  Bed Mobility Overal bed mobility: Needs  Assistance Bed Mobility: Rolling;Sidelying to Sit Rolling: Min assist Sidelying to sit: Min assist       General bed mobility comments: verbal/tactile cuing to initiate & complete    Transfers Overall transfer level: Needs assistance Equipment used: 1 person hand held assist Transfers: Sit to/from Stand;Bed to chair/wheelchair/BSC Sit to Stand: Min assist Stand pivot transfers: Min assist         General transfer comment: verbal/tactile cuing for placement to ensure pt knows where recliner is    Ambulation/Gait Ambulation/Gait assistance: Min assist Gait Distance (Feet):  (3 ft forwards & backwards  x 2) Assistive device: 1 person hand held assist Gait Pattern/deviations: Decreased step length - left;Decreased step length - right;Decreased dorsiflexion - right;Decreased dorsiflexion - left;Decreased stride length;Shuffle Gait velocity: decreased     General Gait Details: Requires seated rest break after first gait trial 2/2 c/o SOB but SpO2 >90% on 4L   Stairs             Wheelchair Mobility    Modified Rankin (Stroke Patients Only)       Balance Overall balance assessment: Needs assistance Sitting-balance support: Feet supported;Bilateral upper extremity supported Sitting balance-Leahy Scale: Fair Sitting balance - Comments: close supervision static sitting   Standing balance support: Single extremity supported;During functional activity Standing balance-Leahy Scale: Fair                              Cognition Arousal/Alertness: Lethargic Behavior During Therapy: Flat affect Overall Cognitive Status: Impaired/Different from  baseline Area of Impairment: Orientation;Attention;Memory;Following commands;Awareness;Safety/judgement;Problem solving                 Orientation Level: Disoriented to;Time;Situation;Place     Following Commands: Follows one step commands consistently;Follows multi-step commands with increased  time Safety/Judgement: Decreased awareness of safety;Decreased awareness of deficits Awareness: Anticipatory;Emergent;Intellectual   General Comments: PT provides verbal/tactile cuing throughout session 2/2 pt is legally blind.        Exercises Other Exercises Other Exercises: Pt performs 5x sit<>stand with min assist with 1UE HHA with focus on BLE strengthening but pt does lean posteriorly back on recliner    General Comments        Pertinent Vitals/Pain Pain Assessment: Faces Faces Pain Scale: Hurts a little bit Pain Location: generalized pain Pain Descriptors / Indicators: Aching;Discomfort Pain Intervention(s): Limited activity within patient's tolerance;Monitored during session    Home Living                          Prior Function            PT Goals (current goals can now be found in the care plan section) Acute Rehab PT Goals Patient Stated Goal: go home, get better PT Goal Formulation: With patient/family Time For Goal Achievement: 05/07/21 Potential to Achieve Goals: Good Progress towards PT goals: Progressing toward goals    Frequency    Min 2X/week      PT Plan Current plan remains appropriate    Co-evaluation              AM-PAC PT "6 Clicks" Mobility   Outcome Measure  Help needed turning from your back to your side while in a flat bed without using bedrails?: A Little Help needed moving from lying on your back to sitting on the side of a flat bed without using bedrails?: A Little Help needed moving to and from a bed to a chair (including a wheelchair)?: A Little Help needed standing up from a chair using your arms (e.g., wheelchair or bedside chair)?: A Little Help needed to walk in hospital room?: A Lot Help needed climbing 3-5 steps with a railing? : Total 6 Click Score: 15    End of Session Equipment Utilized During Treatment: Oxygen Activity Tolerance: Patient tolerated treatment well Patient left: in chair;with  nursing/sitter in room Nurse Communication: Mobility status (O2) PT Visit Diagnosis: Muscle weakness (generalized) (M62.81);Unsteadiness on feet (R26.81);Difficulty in walking, not elsewhere classified (R26.2)     Time: 2831-5176 PT Time Calculation (min) (ACUTE ONLY): 24 min  Charges:  $Therapeutic Activity: 23-37 mins                     Lavone Nian, PT, DPT 04/25/21, 1:38 PM    Waunita Schooner 04/25/2021, 1:36 PM

## 2021-04-25 NOTE — Care Management Important Message (Signed)
Important Message  Patient Details  Name: Jessica Nash MRN: [id number] Date of Birth: 02-Nov-1932   Medicare Important Message Given:  Yes     Dannette Barbara 04/25/2021, 12:39 PM

## 2021-04-25 NOTE — Progress Notes (Signed)
Speech Language Pathology Treatment:    Patient Details Name: Jessica Nash MRN: [id number] DOB: [birth year] Today's Date: 04/25/2021 Time: 1517-6160 SLP Time Calculation (min) (ACUTE ONLY): 20 min  Assessment / Plan / Recommendation Clinical Impression  Pt seen for diet tolerance/clinical swallowing evaluation. Upon SLP entrance to room, pt upright in recliner with NT present. Per NT, pt tolerated breakfast (including pureed pancakes and coffee) without overt s/sx pharyngeal dysphagia. NT also noted pt benefited from tactile/verbal cues to feed self given that pt is legally blind.  Pt eager to attempt trials of upgraded consistencies. Pt edentulous; however, consumes regular solids PTA per pt report.   Pt given trials of solids (x2 saltine crackers presented in quarters) and thin liquids (via straw sip). Pt presents with s/sx mild oral dysphagia c/b prolonged mastication of select solids. Suspect oral deficits due to dental status and may be exacerbated by mental status. Adequate oral clearance obtained across trials. Oral phase was functional for sips of thin liquids via straw.   Recommend diet upgrade to Dysphagia Level 2 (Chopped) with Thin Liquids and safe swallowing strategies/aspiration precautions as outlined below.   SLP to f/u per POC for diet tolerance as pt remains at increased risk for aspiration/aspiration PNA given dental status, mental status, and multiple medical comorbidities.   Pt and RN made aware of results of clinical swallowing re-assessment, diet recommendations, safe swallowing strategies/aspiration precautions, and SLP POC. Pt verbalized understanding/agreement; however, suspect need for reinforcement of content by medical team given fluctuations in pt's mental status.    HPI HPI: Per 58 H&P "Jessica Nash is a 87 y.o. female with medical history significant for chronic respiratory failure on 2 L of oxygen, coronary artery disease on dual antiplatelet therapy,  legally blind who was brought into the ER by EMS for evaluation of change in mental status.  Her daughter states that 3 days ago patient was noted to have loose, dark watery stools with complaints of abdominal discomfort.  She also states that the patient had foul-smelling urine.    She states that patient only takes prescribed medications and denies any NSAID use.  On the morning of her admission patient's daughter states that she found the patient lying across her bed poorly responsive and off her oxygen.  This concerned her so she called EMS and patient was brought to the emergency room.  At baseline according to her daughter she is usually oriented to person, place and time and is able to carry out some activities of daily living.  She states that patient usually takes tramadol as needed for pain and has not had any new medication changes.  I am unable to do a review of systems on this patient due to her mental status changes.  ABG  7.29/85/112/40.9/97.9  Sodium 140, potassium 4.7, chloride 97, bicarb 34, glucose 179, BUN 30, creatinine 1.34, calcium 9.2, alkaline phosphatase 51, albumin 3.8, AST 19, ALT 12, total protein 7.6, total bilirubin 0.7, BNP 454, lactic acid 1.5, procalcitonin less than 0.10, white count 7.4, hemoglobin 13.5, hematocrit 45.4, MCV 100.6, RDW 14.6, platelet count 258, PT 15.4, INR 1.2  Twelve-lead EKG reviewed by me shows sinus tachycardia with right bundle branch block.        ED Course: Patient is an 87 year old female who presents to the ER via EMS for evaluation of mental status changes.  She was noted to have hypercapnic respiratory failure in the ER and is currently on a BiPAP.  She will be admitted to the hospital for  further evaluation." CXR 04/20/21 "Right greater than left interstitial and airspace disease is lower  lung predominant. Pulmonary edema and/or infection.     Probable small bilateral pleural effusions.     Aortic Atherosclerosis" Head CT 04/20/21 "No evidence of  acute intracranial abnormality.     Mild chronic small vessel ischemic changes within the cerebral white  matter, progressed from the head CT of 07/04/2008.     Paranasal sinus disease at the imaged levels, as described.     Left phthisis bulbi."      SLP Plan  Continue with current plan of care      Recommendations for follow up therapy are one component of a multi-disciplinary discharge planning process, led by the attending physician.  Recommendations may be updated based on patient status, additional functional criteria and insurance authorization.    Recommendations  Diet recommendations: Dysphagia 2 (fine chop);Thin liquid Medication Administration: Crushed with puree Supervision: Staff to assist with self feeding;Full supervision/cueing for compensatory strategies Compensations: Minimize environmental distractions;Slow rate;Small sips/bites Postural Changes and/or Swallow Maneuvers: Out of bed for meals;Seated upright 90 degrees;Upright 30-60 min after meal                Oral Care Recommendations: Oral care BID;Staff/trained caregiver to provide oral care Follow Up Recommendations: Skilled nursing-short term rehab (<3 hours/day) Assistance recommended at discharge: Frequent or constant Supervision/Assistance SLP Visit Diagnosis: Dysphagia, oral phase (R13.11) Plan: Continue with current plan of care       Jessica Nash, M.S., [facility] [phone] Jessica Nash)                 Jessica Nash  04/25/2021, 11:17 AM

## 2021-04-25 NOTE — Plan of Care (Signed)
  Problem: Clinical Measurements: Goal: Ability to maintain a body temperature in the normal range will improve Outcome: Progressing   Problem: Respiratory: Goal: Ability to maintain a clear airway will improve Outcome: Progressing   Problem: Activity: Goal: Ability to tolerate increased activity will improve Outcome: Not Progressing

## 2021-04-25 NOTE — Plan of Care (Signed)
  Problem: Clinical Measurements: Goal: Ability to maintain a body temperature in the normal range will improve Outcome: Progressing   Problem: Respiratory: Goal: Ability to maintain adequate ventilation will improve Outcome: Progressing Goal: Ability to maintain a clear airway will improve Outcome: Progressing   

## 2021-04-25 NOTE — Progress Notes (Signed)
PROGRESS NOTE  Jessica Nash QGB:[id number] DOB: 1932-07-24 DOA: 04/20/2021 PCP: Pcp, No   LOS: 5 days   Brief Narrative / Interim history: 87 year old female with chronic respiratory failure on 2 L of oxygen at home, CAD on dual antiplatelet therapy, legally blind, who was brought into the hospital for mental status changes.  History is obtained from daughter over the phone as patient is confused, and daughter tells me that she has been more confused over the last couple of days.  She also mentions that for "quite some time" patient has been having intermittent memory problems with occasional forgetfulness.  She was recently hospitalized about 4 weeks ago in [place] and that is when she was diagnosed with hypoxia and placed on oxygen.  Subjective / 24h Interval events: Complains of "pain all over" this morning. No further agitation  Assessment & Plan: Principal Problem Acute on chronic hypoxic and hypercarbic respiratory failure-not sure about underlying etiology, I wonder if she has underlying COPD -Continue supplemental oxygen to maintain sats above 90%, ABG looks improved, no longer needs BiPAP -Continue DuoNebs -will put her on continuous pulse ox, family questioning whether she needs oxygen at home  Active Problems Acute metabolic encephalopathy-based on my discussion with the daughter it is possible that she may have mild underlying dementia, likely worsened by the respiratory failure and hospitalization.  MRI brain negative for acute CVA  Abdominal pain-unclear etiology, eating well, no nausea, CT scan unremarkable for acute causes other than nephrolithiasis.   Acute on chronic diastolic CHF-chest x-ray was suggestive of mild pulmonary edema with bilateral pleural effusions, BNP was elevated on admission.  Continue Lasix -2D echo showed normal EF -currently euvolemic  CAD-daughter reports that she has had stents in the past.  She is on aspirin and Plavix which is now on hold due  to concern for dark stools.  Add back plavix today  Concern for dark stools-hemoglobin remained stable, no evidence of GI bleed at this point.  CT scan negative for acute processes  Possible UTI, possible CAP-finished 5 days of antibiotics. Cultures have remained negative  Type 2 diabetes mellitus-continue sliding scale, hold oral agents.  A1c 7.0  CBG (last 3)  Recent Labs    04/25/21 0552 04/25/21 0847 04/25/21 1304  GLUCAP 127* 127* 258*   History of breast cancer-on home Arimidex  Scheduled Meds:  aspirin EC  81 mg Oral Daily   atorvastatin  10 mg Oral Daily   brimonidine  1 drop Both Eyes BID   And   timolol  1 drop Both Eyes BID   doxycycline  100 mg Oral Q12H   furosemide  40 mg Oral Daily   gabapentin  100 mg Oral BID   insulin aspart  0-15 Units Subcutaneous Q4H   isosorbide dinitrate  30 mg Oral Daily   latanoprost  1 drop Both Eyes QHS   metoprolol tartrate  100 mg Oral BID   pantoprazole  40 mg Oral BID   QUEtiapine  25 mg Oral QHS   sodium chloride flush  3 mL Intravenous Q12H   Continuous Infusions:  sodium chloride Stopped (04/21/21 2303)   PRN Meds:.sodium chloride, acetaminophen **OR** acetaminophen, haloperidol lactate, ipratropium-albuterol, morphine CONCENTRATE, ondansetron **OR** ondansetron (ZOFRAN) IV, sodium chloride flush  Diet Orders (From admission, onward)     Start     Ordered   04/25/21 1102  DIET DYS 2 Room service appropriate? Yes with Assist; Fluid consistency: Thin  Diet effective now  Comments: LEGALLY BLIND; please offer choices to patient  Question Answer Comment  Room service appropriate? Yes with Assist   Fluid consistency: Thin      04/25/21 1102            DVT prophylaxis: SCDs Start: 04/20/21 1550     Code Status: DNR  Family Communication: Daughter at bedside  Status is: Inpatient  Remains inpatient appropriate because: confusion  Level of care: Progressive  Consultants:  Palliative  Procedures:   2D echo: pending  Microbiology  none  Antimicrobials: Ceftriaxone     Objective: Vitals:   04/24/21 2357 04/25/21 0553 04/25/21 0847 04/25/21 1159  BP: (!) 156/73 (!) 167/76 (!) 157/69 (!) 130/97  Pulse: 73 67 77 76  Resp: 18 18 18 17   Temp:  98.2 F (36.8 C) 97.8 F (36.6 C) 97.9 F (36.6 C)  TempSrc:      SpO2: 96% 97% 98% 100%  Weight:      Height:        Intake/Output Summary (Last 24 hours) at 04/25/2021 1322 Last data filed at 04/25/2021 0500 Gross per 24 hour  Intake 120 ml  Output 500 ml  Net -380 ml    Filed Weights   04/22/21 1702 04/23/21 0405  Weight: 87.6 kg 87.6 kg    Examination:  Constitutional: nad ENMT: mmm Neck: normal, supple Respiratory: cta biL, no wheezing, no crackles Cardiovascular: rrr, no mrg, no edema Abdomen: soft, nt, nd, bs+ Musculoskeletal: no clubbing / cyanosis.  Skin: no rashes seen Neurologic: non focal   Data Reviewed: I have independently reviewed following labs and imaging studies  CBC: Recent Labs  Lab 04/20/21 1155 04/21/21 0805 04/22/21 0504 04/23/21 0928 04/24/21 0555  WBC 7.4 5.1 6.8 6.8 6.1  HGB 13.5 13.5 13.0 13.7 14.2  HCT 45.4 45.6 41.8 44.3 45.1  MCV 106.6* 104.6* 102.5* 99.8 99.1  PLT 258 212 241 241 979    Basic Metabolic Panel: Recent Labs  Lab 04/20/21 1155 04/21/21 0805 04/22/21 0504 04/23/21 0928 04/24/21 0555  NA 140 144 143 142 141  K 4.7 4.3 4.2 3.5 3.7  CL 97* 100 97* 97* 97*  CO2 34* 36* 36* 37* 37*  GLUCOSE 179* 102* 104* 152* 142*  BUN 30* 28* 24* 18 21  CREATININE 1.34* 1.38* 1.06* 0.97 1.06*  CALCIUM 9.2 9.3 9.4 9.7 9.6  MG  --   --   --  2.2  --     Liver Function Tests: Recent Labs  Lab 04/20/21 1155 04/21/21 0805 04/22/21 0504 04/23/21 0928 04/24/21 0555  AST 19 18 23 26 24   ALT 12 12 12 15 15   ALKPHOS 51 47 43 43 42  BILITOT 0.7 0.8 0.9 0.9 1.0  PROT 7.6 7.5 7.5 7.5 7.4  ALBUMIN 3.8 3.7 3.7 3.8 3.8    Coagulation Profile: Recent Labs  Lab  04/20/21 1155  INR 1.2    HbA1C: No results for input(s): HGBA1C in the last 72 hours.  CBG: Recent Labs  Lab 04/24/21 2001 04/24/21 2357 04/25/21 0552 04/25/21 0847 04/25/21 1304  GLUCAP 170* 136* 127* 127* 258*     Recent Results (from the past 240 hour(s))  Blood culture (single)     Status: None   Collection Time: 04/20/21 11:50 AM   Specimen: BLOOD  Result Value Ref Range Status   Specimen Description BLOOD BLOOD RIGHT FOREARM  Final   Special Requests   Final    BOTTLES DRAWN AEROBIC AND ANAEROBIC Blood Culture adequate volume  Culture   Final    NO GROWTH 5 DAYS Performed at [facility], [address] [place], [place] [address]    Report Status 04/25/2021 FINAL  Final  Resp Panel by RT-PCR (Flu A&B, Covid) Nasopharyngeal Swab     Status: None   Collection Time: 04/20/21 11:55 AM   Specimen: Nasopharyngeal Swab; Nasopharyngeal(NP) swabs in vial transport medium  Result Value Ref Range Status   SARS Coronavirus 2 by RT PCR NEGATIVE NEGATIVE Final    Comment: (NOTE) SARS-CoV-2 target nucleic acids are NOT DETECTED.  The SARS-CoV-2 RNA is generally detectable in upper respiratory specimens during the acute phase of infection. The lowest concentration of SARS-CoV-2 viral copies this assay can detect is 138 copies/mL. A negative result does not preclude SARS-Cov-2 infection and should not be used as the sole basis for treatment or other patient management decisions. A negative result may occur with  improper specimen collection/handling, submission of specimen other than nasopharyngeal swab, presence of viral mutation(s) within the areas targeted by this assay, and inadequate number of viral copies(<138 copies/mL). A negative result must be combined with clinical observations, patient history, and epidemiological information. The expected result is Negative.  Fact Sheet for Patients:  EntrepreneurPulse.com.au  Fact Sheet for  Healthcare Providers:  IncredibleEmployment.be  This test is no t yet approved or cleared by the [place] FDA and  has been authorized for detection and/or diagnosis of SARS-CoV-2 by FDA under an Emergency Use Authorization (EUA). This EUA will remain  in effect (meaning this test can be used) for the duration of the COVID-19 declaration under Section 564(b)(1) of the Act, 21 U.S.C.section 360bbb-3(b)(1), unless the authorization is terminated  or revoked sooner.       Influenza A by PCR NEGATIVE NEGATIVE Final   Influenza B by PCR NEGATIVE NEGATIVE Final    Comment: (NOTE) The Xpert Xpress SARS-CoV-2/FLU/RSV plus assay is intended as an aid in the diagnosis of influenza from Nasopharyngeal swab specimens and should not be used as a sole basis for treatment. Nasal washings and aspirates are unacceptable for Xpert Xpress SARS-CoV-2/FLU/RSV testing.  Fact Sheet for Patients: EntrepreneurPulse.com.au  Fact Sheet for Healthcare Providers: IncredibleEmployment.be  This test is not yet approved or cleared by the [place] FDA and has been authorized for detection and/or diagnosis of SARS-CoV-2 by FDA under an Emergency Use Authorization (EUA). This EUA will remain in effect (meaning this test can be used) for the duration of the COVID-19 declaration under Section 564(b)(1) of the Act, 21 U.S.C. section 360bbb-3(b)(1), unless the authorization is terminated or revoked.  Performed at [facility], [place], [place] [address]   Respiratory (~20 pathogens) panel by PCR     Status: None   Collection Time: 04/21/21  2:58 PM   Specimen: Nasopharyngeal Swab; Respiratory  Result Value Ref Range Status   Adenovirus NOT DETECTED NOT DETECTED Final   Coronavirus 229E NOT DETECTED NOT DETECTED Final    Comment: (NOTE) The Coronavirus on the Respiratory Panel, DOES NOT test for the novel  Coronavirus  (2019 nCoV)    Coronavirus HKU1 NOT DETECTED NOT DETECTED Final   Coronavirus NL63 NOT DETECTED NOT DETECTED Final   Coronavirus OC43 NOT DETECTED NOT DETECTED Final   Metapneumovirus NOT DETECTED NOT DETECTED Final   Rhinovirus / Enterovirus NOT DETECTED NOT DETECTED Final   Influenza A NOT DETECTED NOT DETECTED Final   Influenza B NOT DETECTED NOT DETECTED Final   Parainfluenza Virus 1 NOT DETECTED NOT  DETECTED Final   Parainfluenza Virus 2 NOT DETECTED NOT DETECTED Final   Parainfluenza Virus 3 NOT DETECTED NOT DETECTED Final   Parainfluenza Virus 4 NOT DETECTED NOT DETECTED Final   Respiratory Syncytial Virus NOT DETECTED NOT DETECTED Final   Bordetella pertussis NOT DETECTED NOT DETECTED Final   Bordetella Parapertussis NOT DETECTED NOT DETECTED Final   Chlamydophila pneumoniae NOT DETECTED NOT DETECTED Final   Mycoplasma pneumoniae NOT DETECTED NOT DETECTED Final    Comment: Performed at [facility], [place] [address] [place], [place] [address]      Radiology Studies: MR BRAIN WO CONTRAST  Result Date: 04/24/2021 CLINICAL DATA:  Mental status change EXAM: MRI HEAD WITHOUT CONTRAST TECHNIQUE: Multiplanar, multiecho pulse sequences of the brain and surrounding structures were obtained without intravenous contrast. COMPARISON:  No prior MRI, correlation is made with CT head 04/20/2021 FINDINGS: Evaluation is somewhat limited by motion artifact. Brain: No restricted diffusion to suggest acute or subacute infarction. No acute hemorrhage, mass, mass effect, or midline shift. T2 hyperintense signal in the periventricular white matter, likely the sequela of chronic small vessel ischemic disease. No extra-axial collection or hydrocephalus. Vascular: Normal flow voids. Skull and upper cervical spine: Normal marrow signal. Sinuses/Orbits: Left phthisis bulbi. Status post right lens replacement. Mucosal thickening in the left ethmoid air cells. Other: None. IMPRESSION: No acute intracranial  process. Electronically Signed   By: Merilyn Baba M.D.   On: 04/24/2021 22:08     Marzetta Board, MD, PhD [facility]  Between 7 am - 7 pm I am available, please contact me via Amion (for emergencies) or Securechat (non urgent messages)  Between 7 pm - 7 am I am not available, please contact night coverage MD/APP via Amion

## 2021-04-26 DIAGNOSIS — J9602 Acute respiratory failure with hypercapnia: Secondary | ICD-10-CM | POA: Diagnosis not present

## 2021-04-26 DIAGNOSIS — C50919 Malignant neoplasm of unspecified site of unspecified female breast: Secondary | ICD-10-CM | POA: Diagnosis not present

## 2021-04-26 DIAGNOSIS — N390 Urinary tract infection, site not specified: Secondary | ICD-10-CM | POA: Diagnosis not present

## 2021-04-26 DIAGNOSIS — J9601 Acute respiratory failure with hypoxia: Secondary | ICD-10-CM | POA: Diagnosis not present

## 2021-04-26 LAB — TYPE AND SCREEN
ABO/RH(D): O NEG
Antibody Screen: POSITIVE
Unit division: 0
Unit division: 0

## 2021-04-26 LAB — BPAM RBC
Blood Product Expiration Date: 202212092359
Blood Product Expiration Date: 202212132359
Unit Type and Rh: 9500
Unit Type and Rh: 9500

## 2021-04-26 LAB — COMPREHENSIVE METABOLIC PANEL WITH GFR
ALT: 15 U/L (ref 0–44)
AST: 18 U/L (ref 15–41)
Albumin: 3.6 g/dL (ref 3.5–5.0)
Alkaline Phosphatase: 37 U/L — ABNORMAL LOW (ref 38–126)
Anion gap: 7 (ref 5–15)
BUN: 26 mg/dL — ABNORMAL HIGH (ref 8–23)
CO2: 38 mmol/L — ABNORMAL HIGH (ref 22–32)
Calcium: 9.7 mg/dL (ref 8.9–10.3)
Chloride: 95 mmol/L — ABNORMAL LOW (ref 98–111)
Creatinine, Ser: 1.28 mg/dL — ABNORMAL HIGH (ref 0.44–1.00)
GFR, Estimated: 41 mL/min — ABNORMAL LOW
Glucose, Bld: 117 mg/dL — ABNORMAL HIGH (ref 70–99)
Potassium: 3.7 mmol/L (ref 3.5–5.1)
Sodium: 140 mmol/L (ref 135–145)
Total Bilirubin: 1 mg/dL (ref 0.3–1.2)
Total Protein: 7.1 g/dL (ref 6.5–8.1)

## 2021-04-26 LAB — GLUCOSE, CAPILLARY
Glucose-Capillary: 133 mg/dL — ABNORMAL HIGH (ref 70–99)
Glucose-Capillary: 115 mg/dL — ABNORMAL HIGH (ref 70–99)
Glucose-Capillary: 182 mg/dL — ABNORMAL HIGH (ref 70–99)

## 2021-04-26 LAB — CBC
HCT: 43.1 % (ref 36.0–46.0)
Hemoglobin: 13.6 g/dL (ref 12.0–15.0)
MCH: 31.6 pg (ref 26.0–34.0)
MCHC: 31.6 g/dL (ref 30.0–36.0)
MCV: 100 fL (ref 80.0–100.0)
Platelets: 251 10*3/uL (ref 150–400)
RBC: 4.31 MIL/uL (ref 3.87–5.11)
RDW: 14.7 % (ref 11.5–15.5)
WBC: 5.4 10*3/uL (ref 4.0–10.5)
nRBC: 0 % (ref 0.0–0.2)

## 2021-04-26 MED ORDER — QUETIAPINE FUMARATE 25 MG PO TABS: 25.0000 mg | ORAL_TABLET | Freq: Every day | ORAL | 0 refills | Status: AC

## 2021-04-26 MED ORDER — FUROSEMIDE 20 MG PO TABS: 20.0000 mg | ORAL_TABLET | Freq: Every day | ORAL | 0 refills | Status: AC | PRN

## 2021-04-26 NOTE — Discharge Summary (Signed)
Physician Discharge Summary  Jessica Nash YJE:[id number] DOB: February 08, 1933 DOA: 04/20/2021  PCP: Pcp, No  Admit date: 04/20/2021 Discharge date: 04/26/2021  Admitted From: home Disposition:  home  Recommendations for Outpatient Follow-up:  Follow up with PCP in 1-2 weeks Please obtain BMP/CBC in one week Please follow up on the following pending results:  Home Health: PT, RN Equipment/Devices: home O2  Discharge Condition: stable CODE STATUS: DNR Diet recommendation: regular  HPI: Per admitting MD, Jessica Nash is a 87 y.o. female with medical history significant for chronic respiratory failure on 2 L of oxygen, coronary artery disease on dual antiplatelet therapy, legally blind who was brought into the ER by EMS for evaluation of change in mental status. Her daughter states that 3 days ago patient was noted to have loose, dark watery stools with complaints of abdominal discomfort.  She also states that the patient had foul-smelling urine.  She states that patient only takes prescribed medications and denies any NSAID use. On the morning of her admission patient's daughter states that she found the patient lying across her bed poorly responsive and off her oxygen.  This concerned her so she called EMS and patient was brought to the emergency room. At baseline according to her daughter she is usually oriented to person, place and time and is able to carry out some activities of daily living. She states that patient usually takes tramadol as needed for pain and has not had any new medication changes.  Hospital Course / Discharge diagnoses: Principal Problem Acute on chronic hypoxic and hypercarbic respiratory failure-not sure about underlying etiology, I wonder if she has underlying COPD. On admission she required to be placed on BiPAP due to hypercarbic respiratory acidosis. Continue supplemental oxygen to maintain sats above 90%, ABG looks improved, no longer needs BiPAP. Respiratory status  remained stable. Apparently, prior to admission patient got some Nyquil to help her sleep. It is possible that this may have led to respiratory depression and CO2 retention. Advised family to avoid in the future   Active Problems Acute metabolic encephalopathy-based on my discussion with the daughter it is possible that she may have mild underlying dementia, likely worsened by the respiratory failure and hospitalization.  MRI brain negative for acute CVA. CLose to baseline Abdominal pain-unclear etiology, eating well, no nausea, CT scan unremarkable for acute causes other than nephrolithiasis.  Acute on chronic diastolic CHF-chest x-ray was suggestive of mild pulmonary edema with bilateral pleural effusions, BNP was elevated on admission.  Improved after Lasix. 2D echo showed normal EF, currently euvolemic. Recommend PCP follow up in a week and a low sodium diet. Continue Lasix prn CAD-continue Aspirin. She no longer takes Plavix CKD 3a - baseline Cr ~ 1.2-1.3. Currently at baseline Concern for dark stools-hemoglobin remained stable, no evidence of GI bleed at this point.  CT scan negative for acute processes Possible UTI, possible CAP-finished 5 days of antibiotics. Cultures have remained negative Type 2 diabetes mellitus-continue home agents  Sepsis ruled out   Discharge Instructions   Allergies as of 04/26/2021   No Known Allergies      Medication List     STOP taking these medications    amLODipine-benazepril 10-20 MG capsule Commonly known as: LOTREL   clopidogrel 75 MG tablet Commonly known as: PLAVIX   dorzolamide 2 % ophthalmic solution Commonly known as: TRUSOPT   HYDROcodone-acetaminophen 5-325 MG tablet Commonly known as: NORCO/VICODIN   polyethylene glycol 17 g packet Commonly known as: MIRALAX / GLYCOLAX  ranitidine 300 MG tablet Commonly known as: ZANTAC   sennosides-docusate sodium 8.6-50 MG tablet Commonly known as: SENOKOT-S   travoprost  (benzalkonium) 0.004 % ophthalmic solution Commonly known as: TRAVATAN       TAKE these medications    amLODipine 5 MG tablet Commonly known as: NORVASC Take 5 mg by mouth daily.   anastrozole 1 MG tablet Commonly known as: ARIMIDEX Take 1 mg by mouth daily.   aspirin 81 MG tablet Take 81 mg by mouth daily.   atorvastatin 40 MG tablet Commonly known as: LIPITOR Take 40 mg by mouth at bedtime.   atorvastatin 10 MG tablet Commonly known as: LIPITOR Take 1 tablet (10 mg total) by mouth daily.   brimonidine-timolol 0.2-0.5 % ophthalmic solution Commonly known as: COMBIGAN Place 1 drop into both eyes every 12 (twelve) hours.   calcium-vitamin D 500-200 MG-UNIT tablet Take 1 tablet by mouth 2 (two) times daily with a meal.   cholecalciferol 25 MCG (1000 UNIT) tablet Commonly known as: VITAMIN D3 Take 1,000 Units by mouth daily.   famotidine 40 MG tablet Commonly known as: PEPCID Take 40 mg by mouth daily.   furosemide 20 MG tablet Commonly known as: Lasix Take 1 tablet (20 mg total) by mouth daily as needed for fluid or edema.   gabapentin 300 MG capsule Commonly known as: NEURONTIN Take 300 mg by mouth at bedtime.   glipiZIDE 5 MG tablet Commonly known as: GLUCOTROL TAKE 1 TABLET BY MOUTH DAILY   isosorbide dinitrate 30 MG tablet Commonly known as: ISORDIL Take 30 mg by mouth daily.   latanoprost 0.005 % ophthalmic solution Commonly known as: XALATAN 1 drop at bedtime.   metoprolol tartrate 100 MG tablet Commonly known as: LOPRESSOR TAKE 1 TABLET BY MOUTH TWICE A DAY   QUEtiapine 25 MG tablet Commonly known as: SEROQUEL Take 1 tablet (25 mg total) by mouth at bedtime.   traMADol 50 MG tablet Commonly known as: ULTRAM Take 25 mg by mouth every 6 (six) hours as needed.        Consultations: None   Procedures/Studies:  CT ABDOMEN PELVIS WO CONTRAST  Result Date: 04/24/2021 CLINICAL DATA:  Abdominal pain.  Altered mental status. EXAM: CT  ABDOMEN AND PELVIS WITHOUT CONTRAST TECHNIQUE: Multidetector CT imaging of the abdomen and pelvis was performed following the standard protocol without IV contrast. COMPARISON:  11/03/2009 FINDINGS: Lower chest: Small right pleural effusion and mild right basilar atelectasis. Hepatobiliary: No mass visualized on this unenhanced exam. Probable tiny gallstones noted, however there is no evidence of cholecystitis or biliary ductal dilatation. Pancreas: No mass or inflammatory process visualized on this unenhanced exam. Spleen:  Within normal limits in size. Adrenals/Urinary tract: A left adrenal mass is again seen which contains an internal focus of fat and calcification. This measures 3.3 x 2.2 cm, and is consistent with a benign myelolipoma. Moderate diffuse right renal parenchymal atrophy is seen. Several fluid attenuation cysts are noted in both kidneys. Several tiny less than 5 mm calcifications are seen in both renal hila, which may represent vascular calcifications versus tiny nonobstructing calculi. No evidence of ureteral calculi or hydronephrosis. Unremarkable unopacified urinary bladder. Stomach/Bowel: No evidence of obstruction, inflammatory process, or abnormal fluid collections. Normal appendix visualized. Diffuse colonic diverticulosis is seen, however there is no evidence of diverticulitis. Vascular/Lymphatic: No pathologically enlarged lymph nodes identified. No evidence of abdominal aortic aneurysm. Aortic atherosclerotic calcification noted. Reproductive: Prior hysterectomy noted. Adnexal regions are unremarkable in appearance. Other:  None. Musculoskeletal: No suspicious  bone lesions identified. Advanced lumbar degenerative spondylosis noted. IMPRESSION: Tiny nonobstructing bilateral renal calculi and vascular calcifications. No evidence of ureteral calculi, hydronephrosis, or other acute findings. Colonic diverticulosis, without radiographic evidence of diverticulitis. Probable cholelithiasis. No  radiographic evidence of cholecystitis. Benign left adrenal myelolipoma. Small right pleural effusion and mild right basilar atelectasis. Electronically Signed   By: Marlaine Hind M.D.   On: 04/24/2021 14:23   CT HEAD WO CONTRAST (5MM)  Result Date: 04/20/2021 CLINICAL DATA:  Mental status change, unknown cause. EXAM: CT HEAD WITHOUT CONTRAST TECHNIQUE: Contiguous axial images were obtained from the base of the skull through the vertex without intravenous contrast. COMPARISON:  Head CT 07/04/2008. FINDINGS: Brain: Cerebral volume is unremarkable for age. Mild patchy and ill-defined hypoattenuation within the cerebral white matter, nonspecific but compatible with chronic small vessel ischemic disease. There is no acute intracranial hemorrhage. No demarcated cortical infarct. No extra-axial fluid collection. No evidence of an intracranial mass. No midline shift. Vascular: No hyperdense vessel.  Atherosclerotic calcifications. Skull: Normal. Negative for fracture or focal lesion. Sinuses/Orbits: Visualized orbits show no acute finding. Left phthisis bulbi. Mild mucosal thickening within the left frontal sinus. Mild mucosal thickening versus fluid within a posterior left ethmoid air cell. IMPRESSION: No evidence of acute intracranial abnormality. Mild chronic small vessel ischemic changes within the cerebral white matter, progressed from the head CT of 07/04/2008. Paranasal sinus disease at the imaged levels, as described. Left phthisis bulbi. Electronically Signed   By: Kellie Simmering D.O.   On: 04/20/2021 15:00   MR BRAIN WO CONTRAST  Result Date: 04/24/2021 CLINICAL DATA:  Mental status change EXAM: MRI HEAD WITHOUT CONTRAST TECHNIQUE: Multiplanar, multiecho pulse sequences of the brain and surrounding structures were obtained without intravenous contrast. COMPARISON:  No prior MRI, correlation is made with CT head 04/20/2021 FINDINGS: Evaluation is somewhat limited by motion artifact. Brain: No restricted  diffusion to suggest acute or subacute infarction. No acute hemorrhage, mass, mass effect, or midline shift. T2 hyperintense signal in the periventricular white matter, likely the sequela of chronic small vessel ischemic disease. No extra-axial collection or hydrocephalus. Vascular: Normal flow voids. Skull and upper cervical spine: Normal marrow signal. Sinuses/Orbits: Left phthisis bulbi. Status post right lens replacement. Mucosal thickening in the left ethmoid air cells. Other: None. IMPRESSION: No acute intracranial process. Electronically Signed   By: Merilyn Baba M.D.   On: 04/24/2021 22:08   DG Chest Portable 1 View  Result Date: 04/20/2021 CLINICAL DATA:  Hypoxia. Abdominal pain. Nausea. Evaluate infiltrate. EXAM: PORTABLE CHEST 1 VIEW COMPARISON:  01/15/2009 FINDINGS: Midline trachea. Cardiomegaly accentuated by AP portable technique. Probable small bilateral pleural effusions. No pneumothorax. Right greater than left lower lung predominant interstitial and airspace disease. IMPRESSION: Right greater than left interstitial and airspace disease is lower lung predominant. Pulmonary edema and/or infection. Probable small bilateral pleural effusions. Aortic Atherosclerosis (ICD10-I70.0). Electronically Signed   By: Abigail Miyamoto M.D.   On: 04/20/2021 11:32   ECHOCARDIOGRAM COMPLETE  Result Date: 04/21/2021    ECHOCARDIOGRAM REPORT   Patient Name:   Jessica Nash Date of Exam: 04/21/2021 Medical Rec #:  [id number]   Height:       65.0 in Accession #:    [id number]  Weight:       208.0 lb Date of Birth:  1932/11/30   BSA:          2.011 m Patient Age:    13 years    BP:  142/80 mmHg Patient Gender: F           HR:           109 bpm. Exam Location:  [facility] Procedure: 2D Echo, Cardiac Doppler and Color Doppler Indications:     CHF-acute diastolic I78.67  History:         Patient has no prior history of Echocardiogram examinations.                  CAD and Previous Myocardial Infarction; Risk                   Factors:Hypertension.  Sonographer:     Sherrie Sport Referring Phys:  [id number] AGBATA Diagnosing Phys: Ida Rogue MD  Sonographer Comments: No apical window, no subcostal window and Technically difficult study due to poor echo windows. Image acquisition challenging due to uncooperative patient. IMPRESSIONS  1. Left ventricular ejection fraction, by estimation, is 60 to 65%. The left ventricle has normal function. The left ventricle has no regional wall motion abnormalities. There is mild left ventricular hypertrophy. Left ventricular diastolic parameters are indeterminate.  2. Right ventricular systolic function is normal. The right ventricular size is normal.  3. The mitral valve was not well visualized. No evidence of mitral valve regurgitation. No evidence of mitral stenosis.  4. The aortic valve was not well visualized. Aortic valve regurgitation is not visualized. Aortic valve sclerosis is present, with no evidence of aortic valve stenosis.  5. The inferior vena cava is normal in size with greater than 50% respiratory variability, suggesting right atrial pressure of 3 mmHg. FINDINGS  Left Ventricle: Left ventricular ejection fraction, by estimation, is 60 to 65%. The left ventricle has normal function. The left ventricle has no regional wall motion abnormalities. The left ventricular internal cavity size was normal in size. There is  mild left ventricular hypertrophy. Left ventricular diastolic parameters are indeterminate. Right Ventricle: The right ventricular size is normal. No increase in right ventricular wall thickness. Right ventricular systolic function is normal. Left Atrium: Left atrial size was normal in size. Right Atrium: Right atrial size was normal in size. Pericardium: There is no evidence of pericardial effusion. Mitral Valve: The mitral valve was not well visualized. No evidence of mitral valve stenosis. Tricuspid Valve: The tricuspid valve is not well visualized. No evidence  of tricuspid stenosis. Aortic Valve: The aortic valve was not well visualized. Aortic valve sclerosis is present, with no evidence of aortic valve stenosis. Pulmonic Valve: The pulmonic valve was not well visualized. No evidence of pulmonic stenosis. Aorta: The aortic root is normal in size and structure. Venous: The inferior vena cava is normal in size with greater than 50% respiratory variability, suggesting right atrial pressure of 3 mmHg. IAS/Shunts: No atrial level shunt detected by color flow Doppler.  LEFT VENTRICLE PLAX 2D LVIDd:         4.50 cm LVIDs:         3.10 cm LV PW:         1.20 cm LV IVS:        1.00 cm LVOT diam:     2.00 cm LVOT Area:     3.14 cm  LEFT ATRIUM         Index LA diam:    3.90 cm 1.94 cm/m   AORTA Ao Root diam: 3.27 cm  SHUNTS Systemic Diam: 2.00 cm Ida Rogue MD Electronically signed by Ida Rogue MD Signature Date/Time: 04/21/2021/10:49:51 AM    Final  Subjective: - no chest pain, shortness of breath, no abdominal pain, nausea or vomiting.   Discharge Exam: BP 140/63 (BP Location: Right Arm)   Pulse 74   Temp 98.7 F (37.1 C)   Resp 20   Ht 5\' 2"  (1.575 m)   Wt 87.6 kg   SpO2 97%   BMI 35.32 kg/m   General: Pt is alert, awake, not in acute distress Cardiovascular: RRR, S1/S2 +, no rubs, no gallops Respiratory: CTA bilaterally, no wheezing, no rhonchi Abdominal: Soft, NT, ND, bowel sounds + Extremities: no edema, no cyanosis    The results of significant diagnostics from this hospitalization (including imaging, microbiology, ancillary and laboratory) are listed below for reference.     Microbiology: Recent Results (from the past 240 hour(s))  Blood culture (single)     Status: None   Collection Time: 04/20/21 11:50 AM   Specimen: BLOOD  Result Value Ref Range Status   Specimen Description BLOOD BLOOD RIGHT FOREARM  Final   Special Requests   Final    BOTTLES DRAWN AEROBIC AND ANAEROBIC Blood Culture adequate volume   Culture    Final    NO GROWTH 5 DAYS Performed at [facility], [address] [place], [place] [id number]    Report Status 04/25/2021 FINAL  Final  Resp Panel by RT-PCR (Flu A&B, Covid) Nasopharyngeal Swab     Status: None   Collection Time: 04/20/21 11:55 AM   Specimen: Nasopharyngeal Swab; Nasopharyngeal(NP) swabs in vial transport medium  Result Value Ref Range Status   SARS Coronavirus 2 by RT PCR NEGATIVE NEGATIVE Final    Comment: (NOTE) SARS-CoV-2 target nucleic acids are NOT DETECTED.  The SARS-CoV-2 RNA is generally detectable in upper respiratory specimens during the acute phase of infection. The lowest concentration of SARS-CoV-2 viral copies this assay can detect is 138 copies/mL. A negative result does not preclude SARS-Cov-2 infection and should not be used as the sole basis for treatment or other patient management decisions. A negative result may occur with  improper specimen collection/handling, submission of specimen other than nasopharyngeal swab, presence of viral mutation(s) within the areas targeted by this assay, and inadequate number of viral copies(<138 copies/mL). A negative result must be combined with clinical observations, patient history, and epidemiological information. The expected result is Negative.  Fact Sheet for Patients:  EntrepreneurPulse.com.au  Fact Sheet for Healthcare Providers:  IncredibleEmployment.be  This test is no t yet approved or cleared by the [place] FDA and  has been authorized for detection and/or diagnosis of SARS-CoV-2 by FDA under an Emergency Use Authorization (EUA). This EUA will remain  in effect (meaning this test can be used) for the duration of the COVID-19 declaration under Section 564(b)(1) of the Act, 21 U.S.C.section 360bbb-3(b)(1), unless the authorization is terminated  or revoked sooner.       Influenza A by PCR NEGATIVE NEGATIVE Final   Influenza B by PCR  NEGATIVE NEGATIVE Final    Comment: (NOTE) The Xpert Xpress SARS-CoV-2/FLU/RSV plus assay is intended as an aid in the diagnosis of influenza from Nasopharyngeal swab specimens and should not be used as a sole basis for treatment. Nasal washings and aspirates are unacceptable for Xpert Xpress SARS-CoV-2/FLU/RSV testing.  Fact Sheet for Patients: EntrepreneurPulse.com.au  Fact Sheet for Healthcare Providers: IncredibleEmployment.be  This test is not yet approved or cleared by the [place] FDA and has been authorized for detection and/or diagnosis of SARS-CoV-2 by FDA under an Emergency Use Authorization (EUA). This EUA will  remain in effect (meaning this test can be used) for the duration of the COVID-19 declaration under Section 564(b)(1) of the Act, 21 U.S.C. section 360bbb-3(b)(1), unless the authorization is terminated or revoked.  Performed at [facility], [address] [place], [place] [address]   Respiratory (~20 pathogens) panel by PCR     Status: None   Collection Time: 04/21/21  2:58 PM   Specimen: Nasopharyngeal Swab; Respiratory  Result Value Ref Range Status   Adenovirus NOT DETECTED NOT DETECTED Final   Coronavirus 229E NOT DETECTED NOT DETECTED Final    Comment: (NOTE) The Coronavirus on the Respiratory Panel, DOES NOT test for the novel  Coronavirus (2019 nCoV)    Coronavirus HKU1 NOT DETECTED NOT DETECTED Final   Coronavirus NL63 NOT DETECTED NOT DETECTED Final   Coronavirus OC43 NOT DETECTED NOT DETECTED Final   Metapneumovirus NOT DETECTED NOT DETECTED Final   Rhinovirus / Enterovirus NOT DETECTED NOT DETECTED Final   Influenza A NOT DETECTED NOT DETECTED Final   Influenza B NOT DETECTED NOT DETECTED Final   Parainfluenza Virus 1 NOT DETECTED NOT DETECTED Final   Parainfluenza Virus 2 NOT DETECTED NOT DETECTED Final   Parainfluenza Virus 3 NOT DETECTED NOT DETECTED Final   Parainfluenza Virus 4 NOT  DETECTED NOT DETECTED Final   Respiratory Syncytial Virus NOT DETECTED NOT DETECTED Final   Bordetella pertussis NOT DETECTED NOT DETECTED Final   Bordetella Parapertussis NOT DETECTED NOT DETECTED Final   Chlamydophila pneumoniae NOT DETECTED NOT DETECTED Final   Mycoplasma pneumoniae NOT DETECTED NOT DETECTED Final    Comment: Performed at [facility], [place]. [address], [place], [place] [address]     Labs: Basic Metabolic Panel: Recent Labs  Lab 04/21/21 0805 04/22/21 0504 04/23/21 0928 04/24/21 0555 04/26/21 0500  NA 144 143 142 141 140  K 4.3 4.2 3.5 3.7 3.7  CL 100 97* 97* 97* 95*  CO2 36* 36* 37* 37* 38*  GLUCOSE 102* 104* 152* 142* 117*  BUN 28* 24* 18 21 26*  CREATININE 1.38* 1.06* 0.97 1.06* 1.28*  CALCIUM 9.3 9.4 9.7 9.6 9.7  MG  --   --  2.2  --   --    Liver Function Tests: Recent Labs  Lab 04/21/21 0805 04/22/21 0504 04/23/21 0928 04/24/21 0555 04/26/21 0500  AST 18 23 26 24 18   ALT 12 12 15 15 15   ALKPHOS 47 43 43 42 37*  BILITOT 0.8 0.9 0.9 1.0 1.0  PROT 7.5 7.5 7.5 7.4 7.1  ALBUMIN 3.7 3.7 3.8 3.8 3.6   CBC: Recent Labs  Lab 04/21/21 0805 04/22/21 0504 04/23/21 0928 04/24/21 0555 04/26/21 0500  WBC 5.1 6.8 6.8 6.1 5.4  HGB 13.5 13.0 13.7 14.2 13.6  HCT 45.6 41.8 44.3 45.1 43.1  MCV 104.6* 102.5* 99.8 99.1 100.0  PLT 212 241 241 239 251   CBG: Recent Labs  Lab 04/25/21 1304 04/25/21 1634 04/25/21 2234 04/26/21 0453 04/26/21 0756  GLUCAP 258* 174* 178* 133* 115*   Hgb A1c No results for input(s): HGBA1C in the last 72 hours. Lipid Profile No results for input(s): CHOL, HDL, LDLCALC, TRIG, CHOLHDL, LDLDIRECT in the last 72 hours. Thyroid function studies No results for input(s): TSH, T4TOTAL, T3FREE, THYROIDAB in the last 72 hours.  Invalid input(s): FREET3 Urinalysis    Component Value Date/Time   COLORURINE AMBER (A) 04/20/2021 1453   APPEARANCEUR CLOUDY (A) 04/20/2021 1453   LABSPEC 1.021 04/20/2021 1453   PHURINE  8.0 04/20/2021  [place] 04/20/2021 1453   HGBUR SMALL (A) 04/20/2021 1453   HGBUR large 07/14/2008 0928   BILIRUBINUR NEGATIVE 04/20/2021 1453   KETONESUR NEGATIVE 04/20/2021 1453   PROTEINUR 100 (A) 04/20/2021 1453   UROBILINOGEN 0.2 07/14/2008 0928   NITRITE NEGATIVE 04/20/2021 1453   LEUKOCYTESUR LARGE (A) 04/20/2021 1453    FURTHER DISCHARGE INSTRUCTIONS:   Get Medicines reviewed and adjusted: Please take all your medications with you for your next visit with your Primary MD   Laboratory/radiological data: Please request your Primary MD to go over all hospital tests and procedure/radiological results at the follow up, please ask your Primary MD to get all Hospital records sent to his/her office.   In some cases, they will be blood work, cultures and biopsy results pending at the time of your discharge. Please request that your primary care M.D. goes through all the records of your hospital data and follows up on these results.   Also Note the following: If you experience worsening of your admission symptoms, develop shortness of breath, life threatening emergency, suicidal or homicidal thoughts you must seek medical attention immediately by calling 911 or calling your MD immediately  if symptoms less severe.   You must read complete instructions/literature along with all the possible adverse reactions/side effects for all the Medicines you take and that have been prescribed to you. Take any new Medicines after you have completely understood and accpet all the possible adverse reactions/side effects.    Do not drive when taking Pain medications or sleeping medications (Benzodaizepines)   Do not take more than prescribed Pain, Sleep and Anxiety Medications. It is not advisable to combine anxiety,sleep and pain medications without talking with your primary care practitioner   Special Instructions: If you have smoked or chewed Tobacco  in the last 2 yrs please stop  smoking, stop any regular Alcohol  and or any Recreational drug use.   Wear Seat belts while driving.   Please note: You were cared for by a hospitalist during your hospital stay. Once you are discharged, your primary care physician will handle any further medical issues. Please note that NO REFILLS for any discharge medications will be authorized once you are discharged, as it is imperative that you return to your primary care physician (or establish a relationship with a primary care physician if you do not have one) for your post hospital discharge needs so that they can reassess your need for medications and monitor your lab values.  Time coordinating discharge: 40 minutes  SIGNED:  Marzetta Board, MD, PhD 04/26/2021, 11:06 AM

## 2021-04-26 NOTE — Progress Notes (Addendum)
Pt kept removing continuous pulse ox this shift. After several attempts to keep on , RN left if off and placed pt on 2L of supplemental O2 for safety. Spot checks with Dinamap, SpO2 90-92% on room air. Due to removal of pulse ox by pt, RN unable to determine SpO2 while sleeping.

## 2021-04-26 NOTE — TOC Transition Note (Signed)
Transition of Care [facility]) - CM/SW Discharge Note   Patient Details  Name: Jessica Nash MRN: [id number] Date of Birth: Jun 03, 1932  Transition of Care [facility]) CM/SW Contact:  Alberteen Sam, LCSW Phone Number: 04/26/2021, 12:16 PM   Clinical Narrative:     Patient to discharge home with home health PT and RN through [facility]. [name] with [facility] informed of dc today.   CSW spoke with patient's daughter Jessica Nash at [phone] who reports she will be picking patient up today . Agreeable for 3in1 to be delivered to room. Reports no additional needs.   Reports being agreeable to a referral being made with [facility], as patient currently has no PCP due to recently moving to the area.   Referral has been made. No further discharge needs at this time.     Final next level of care: [place] Barriers to Discharge: No Barriers Identified   Patient Goals and CMS Choice Patient states their goals for this hospitalization and ongoing recovery are:: to go home CMS Medicare.gov Compare Post Acute Care list provided to:: Patient Represenative (must comment) (daughter) Choice offered to / list presented to : Adult Children  Discharge Placement                       Discharge Plan and Services In-house Referral: Clinical Social Work   Post Acute Care Choice: Durable Medical Equipment (O2L, chronic at baseline)          DME Arranged: 3-N-1 DME Agency: [employer] Date DME Agency Contacted: 04/26/21 Time DME Agency Contacted: [id number] Representative spoke with at DME Agency: [place]: PT, RN [facility] Agency: [place] ([place]) Date [place]: 04/26/21 Time [place]: 1215 Representative spoke with at [place]: [place] ([place]) Interventions     Readmission Risk Interventions No flowsheet data found.

## 2021-05-02 ENCOUNTER — Other Ambulatory Visit: Payer: Self-pay

## 2021-05-02 DIAGNOSIS — Z66 Do not resuscitate: Secondary | ICD-10-CM | POA: Diagnosis present

## 2021-05-02 DIAGNOSIS — W19XXXA Unspecified fall, initial encounter: Secondary | ICD-10-CM | POA: Diagnosis present

## 2021-05-02 DIAGNOSIS — Z20822 Contact with and (suspected) exposure to covid-19: Secondary | ICD-10-CM | POA: Diagnosis present

## 2021-05-02 DIAGNOSIS — N189 Chronic kidney disease, unspecified: Secondary | ICD-10-CM | POA: Diagnosis present

## 2021-05-02 DIAGNOSIS — E739 Lactose intolerance, unspecified: Secondary | ICD-10-CM | POA: Diagnosis present

## 2021-05-02 DIAGNOSIS — G9341 Metabolic encephalopathy: Principal | ICD-10-CM | POA: Diagnosis present

## 2021-05-02 DIAGNOSIS — Z7982 Long term (current) use of aspirin: Secondary | ICD-10-CM

## 2021-05-02 DIAGNOSIS — N1831 Chronic kidney disease, stage 3a: Secondary | ICD-10-CM | POA: Diagnosis present

## 2021-05-02 DIAGNOSIS — S82841A Displaced bimalleolar fracture of right lower leg, initial encounter for closed fracture: Secondary | ICD-10-CM | POA: Diagnosis present

## 2021-05-02 DIAGNOSIS — Z90711 Acquired absence of uterus with remaining cervical stump: Secondary | ICD-10-CM

## 2021-05-02 DIAGNOSIS — E1122 Type 2 diabetes mellitus with diabetic chronic kidney disease: Secondary | ICD-10-CM | POA: Diagnosis present

## 2021-05-02 DIAGNOSIS — I130 Hypertensive heart and chronic kidney disease with heart failure and stage 1 through stage 4 chronic kidney disease, or unspecified chronic kidney disease: Secondary | ICD-10-CM | POA: Diagnosis present

## 2021-05-02 DIAGNOSIS — J9612 Chronic respiratory failure with hypercapnia: Secondary | ICD-10-CM | POA: Diagnosis present

## 2021-05-02 DIAGNOSIS — Z9011 Acquired absence of right breast and nipple: Secondary | ICD-10-CM

## 2021-05-02 DIAGNOSIS — H543 Unqualified visual loss, both eyes: Secondary | ICD-10-CM | POA: Diagnosis present

## 2021-05-02 DIAGNOSIS — E785 Hyperlipidemia, unspecified: Secondary | ICD-10-CM | POA: Diagnosis present

## 2021-05-02 DIAGNOSIS — I2510 Atherosclerotic heart disease of native coronary artery without angina pectoris: Secondary | ICD-10-CM | POA: Diagnosis present

## 2021-05-02 DIAGNOSIS — Z86010 Personal history of colonic polyps: Secondary | ICD-10-CM

## 2021-05-02 DIAGNOSIS — Z7984 Long term (current) use of oral hypoglycemic drugs: Secondary | ICD-10-CM

## 2021-05-02 DIAGNOSIS — Z955 Presence of coronary angioplasty implant and graft: Secondary | ICD-10-CM

## 2021-05-02 DIAGNOSIS — I252 Old myocardial infarction: Secondary | ICD-10-CM

## 2021-05-02 DIAGNOSIS — I248 Other forms of acute ischemic heart disease: Secondary | ICD-10-CM | POA: Diagnosis present

## 2021-05-02 DIAGNOSIS — K219 Gastro-esophageal reflux disease without esophagitis: Secondary | ICD-10-CM | POA: Diagnosis present

## 2021-05-02 DIAGNOSIS — R410 Disorientation, unspecified: Secondary | ICD-10-CM | POA: Diagnosis not present

## 2021-05-02 DIAGNOSIS — Z800 Family history of malignant neoplasm of digestive organs: Secondary | ICD-10-CM

## 2021-05-02 DIAGNOSIS — Z87891 Personal history of nicotine dependence: Secondary | ICD-10-CM

## 2021-05-02 DIAGNOSIS — Z853 Personal history of malignant neoplasm of breast: Secondary | ICD-10-CM

## 2021-05-02 DIAGNOSIS — Z79811 Long term (current) use of aromatase inhibitors: Secondary | ICD-10-CM

## 2021-05-02 DIAGNOSIS — Z79899 Other long term (current) drug therapy: Secondary | ICD-10-CM

## 2021-05-02 LAB — BLOOD GAS, ARTERIAL
Acid-Base Excess: 14.2 mmol/L — ABNORMAL HIGH (ref 0.0–2.0)
Bicarbonate: 42.1 mmol/L — ABNORMAL HIGH (ref 20.0–28.0)
FIO2: 55
O2 Saturation: 97.5 %
Patient temperature: 37
pCO2 arterial: 68 mmHg (ref 32.0–48.0)
pH, Arterial: 7.4 (ref 7.350–7.450)
pO2, Arterial: 96 mmHg (ref 83.0–108.0)

## 2021-05-02 LAB — BASIC METABOLIC PANEL
Anion gap: 7 (ref 5–15)
BUN: 22 mg/dL (ref 8–23)
CO2: 39 mmol/L — ABNORMAL HIGH (ref 22–32)
Calcium: 10 mg/dL (ref 8.9–10.3)
Chloride: 92 mmol/L — ABNORMAL LOW (ref 98–111)
Creatinine, Ser: 1.25 mg/dL — ABNORMAL HIGH (ref 0.44–1.00)
GFR, Estimated: 42 mL/min — ABNORMAL LOW (ref >60)
Glucose, Bld: 66 mg/dL — ABNORMAL LOW (ref 70–99)
Potassium: 4.5 mmol/L (ref 3.5–5.1)
Sodium: 138 mmol/L (ref 135–145)

## 2021-05-02 LAB — CBC
HCT: 42.5 % (ref 36.0–46.0)
Hemoglobin: 12.9 g/dL (ref 12.0–15.0)
MCH: 30.6 pg (ref 26.0–34.0)
MCHC: 30.4 g/dL (ref 30.0–36.0)
MCV: 101 fL — ABNORMAL HIGH (ref 80.0–100.0)
Platelets: 253 10*3/uL (ref 150–400)
RBC: 4.21 MIL/uL (ref 3.87–5.11)
RDW: 14.5 % (ref 11.5–15.5)
WBC: 6.3 10*3/uL (ref 4.0–10.5)
nRBC: 0 % (ref 0.0–0.2)

## 2021-05-02 LAB — TROPONIN I (HIGH SENSITIVITY): Troponin I (High Sensitivity): 25 ng/L — ABNORMAL HIGH (ref <18)

## 2021-05-02 LAB — CBG MONITORING, ED
Glucose-Capillary: 54 mg/dL — ABNORMAL LOW (ref 70–99)
Glucose-Capillary: 70 mg/dL (ref 70–99)

## 2021-05-02 NOTE — ED Triage Notes (Signed)
Pt BIB EMS due to having increased generalized weakness and fatigue for the last few days. Per family, pt has these symptoms when she has an UTI. Pt a&ox3.

## 2021-05-03 ENCOUNTER — Encounter: Payer: Self-pay | Admitting: Radiology

## 2021-05-03 ENCOUNTER — Emergency Department: Payer: Medicare Other

## 2021-05-03 ENCOUNTER — Inpatient Hospital Stay
Admission: EM | Admit: 2021-05-03 | Discharge: 2021-05-05 | DRG: 071 | Disposition: A | Discharge status: Skilled Nursing Facility | Payer: Medicare Other | Attending: Internal Medicine | Admitting: Internal Medicine

## 2021-05-03 DIAGNOSIS — S82841A Displaced bimalleolar fracture of right lower leg, initial encounter for closed fracture: Secondary | ICD-10-CM

## 2021-05-03 DIAGNOSIS — R778 Other specified abnormalities of plasma proteins: Secondary | ICD-10-CM

## 2021-05-03 DIAGNOSIS — G9341 Metabolic encephalopathy: Secondary | ICD-10-CM | POA: Diagnosis not present

## 2021-05-03 DIAGNOSIS — I2510 Atherosclerotic heart disease of native coronary artery without angina pectoris: Secondary | ICD-10-CM | POA: Diagnosis present

## 2021-05-03 DIAGNOSIS — N1831 Chronic kidney disease, stage 3a: Secondary | ICD-10-CM

## 2021-05-03 DIAGNOSIS — J9692 Respiratory failure, unspecified with hypercapnia: Secondary | ICD-10-CM | POA: Diagnosis present

## 2021-05-03 DIAGNOSIS — E785 Hyperlipidemia, unspecified: Secondary | ICD-10-CM

## 2021-05-03 DIAGNOSIS — I10 Essential (primary) hypertension: Secondary | ICD-10-CM

## 2021-05-03 DIAGNOSIS — H547 Unspecified visual loss: Secondary | ICD-10-CM

## 2021-05-03 DIAGNOSIS — S82843A Displaced bimalleolar fracture of unspecified lower leg, initial encounter for closed fracture: Secondary | ICD-10-CM | POA: Diagnosis not present

## 2021-05-03 DIAGNOSIS — C50919 Malignant neoplasm of unspecified site of unspecified female breast: Secondary | ICD-10-CM | POA: Diagnosis present

## 2021-05-03 DIAGNOSIS — N183 Chronic kidney disease, stage 3 unspecified: Secondary | ICD-10-CM | POA: Diagnosis present

## 2021-05-03 DIAGNOSIS — R410 Disorientation, unspecified: Secondary | ICD-10-CM

## 2021-05-03 DIAGNOSIS — R7989 Other specified abnormal findings of blood chemistry: Secondary | ICD-10-CM | POA: Diagnosis present

## 2021-05-03 DIAGNOSIS — E1129 Type 2 diabetes mellitus with other diabetic kidney complication: Secondary | ICD-10-CM | POA: Diagnosis present

## 2021-05-03 DIAGNOSIS — W19XXXA Unspecified fall, initial encounter: Secondary | ICD-10-CM

## 2021-05-03 LAB — BLOOD GAS, VENOUS
Acid-Base Excess: 19.1 mmol/L — ABNORMAL HIGH (ref 0.0–2.0)
Bicarbonate: 47.5 mmol/L — ABNORMAL HIGH (ref 20.0–28.0)
O2 Saturation: 55.8 %
Patient temperature: 37
pCO2, Ven: 70 mmHg — ABNORMAL HIGH (ref 44.0–60.0)
pH, Ven: 7.44 — ABNORMAL HIGH (ref 7.250–7.430)
pO2, Ven: <31 mmHg — CL (ref 32.0–45.0)

## 2021-05-03 LAB — RESP PANEL BY RT-PCR (FLU A&B, COVID) ARPGX2
Influenza A by PCR: NEGATIVE
Influenza B by PCR: NEGATIVE
SARS Coronavirus 2 by RT PCR: NEGATIVE

## 2021-05-03 LAB — TROPONIN I (HIGH SENSITIVITY)
Troponin I (High Sensitivity): 24 ng/L — ABNORMAL HIGH (ref <18)
Troponin I (High Sensitivity): 22 ng/L — ABNORMAL HIGH (ref <18)

## 2021-05-03 LAB — CBG MONITORING, ED
Glucose-Capillary: 83 mg/dL (ref 70–99)
Glucose-Capillary: 76 mg/dL (ref 70–99)
Glucose-Capillary: 101 mg/dL — ABNORMAL HIGH (ref 70–99)
Glucose-Capillary: 76 mg/dL (ref 70–99)
Glucose-Capillary: 131 mg/dL — ABNORMAL HIGH (ref 70–99)

## 2021-05-03 LAB — URINALYSIS, ROUTINE W REFLEX MICROSCOPIC
Bilirubin Urine: NEGATIVE
Glucose, UA: NEGATIVE mg/dL
Hgb urine dipstick: NEGATIVE
Ketones, ur: NEGATIVE mg/dL
Leukocytes,Ua: NEGATIVE
Nitrite: NEGATIVE
Protein, ur: NEGATIVE mg/dL
Specific Gravity, Urine: 1.014 (ref 1.005–1.030)
pH: 7 (ref 5.0–8.0)

## 2021-05-03 LAB — BRAIN NATRIURETIC PEPTIDE: B Natriuretic Peptide: 71.4 pg/mL (ref 0.0–100.0)

## 2021-05-03 LAB — LACTIC ACID, PLASMA: Lactic Acid, Venous: 1.4 mmol/L (ref 0.5–1.9)

## 2021-05-03 MED ORDER — INSULIN ASPART 100 UNIT/ML IJ SOLN: 0.0000 [IU] | INTRAMUSCULAR | Status: DC

## 2021-05-03 MED ORDER — ONDANSETRON HCL 4 MG/2ML IJ SOLN
4.0000 mg | Freq: Three times a day (TID) | INTRAMUSCULAR | Status: DC | PRN
  Administered 2021-05-04: 4 mg via INTRAVENOUS
  Filled 2021-05-03: qty 2

## 2021-05-03 MED ORDER — LORAZEPAM 2 MG/ML IJ SOLN
INTRAMUSCULAR | Status: AC
  Filled 2021-05-03: qty 1

## 2021-05-03 MED ORDER — ASPIRIN EC 81 MG PO TBEC
81.0000 mg | DELAYED_RELEASE_TABLET | Freq: Every day | ORAL | Status: DC
  Administered 2021-05-03 – 2021-05-05 (×3): 81 mg via ORAL
  Filled 2021-05-03 (×3): qty 1

## 2021-05-03 MED ORDER — MORPHINE SULFATE (PF) 2 MG/ML IV SOLN
0.5000 mg | INTRAVENOUS | Status: DC | PRN
  Administered 2021-05-04: 14:00:00 0.5 mg via INTRAVENOUS
  Filled 2021-05-03 (×2): qty 1

## 2021-05-03 MED ORDER — ALBUTEROL SULFATE (2.5 MG/3ML) 0.083% IN NEBU: 2.5000 mg | INHALATION_SOLUTION | RESPIRATORY_TRACT | Status: DC | PRN

## 2021-05-03 MED ORDER — ATORVASTATIN CALCIUM 20 MG PO TABS
40.0000 mg | ORAL_TABLET | Freq: Every day | ORAL | Status: DC
  Administered 2021-05-03 – 2021-05-04 (×2): 40 mg via ORAL
  Filled 2021-05-03 (×2): qty 2

## 2021-05-03 MED ORDER — METOPROLOL TARTRATE 50 MG PO TABS
100.0000 mg | ORAL_TABLET | Freq: Two times a day (BID) | ORAL | Status: DC
  Administered 2021-05-03 – 2021-05-05 (×5): 100 mg via ORAL
  Filled 2021-05-03 (×5): qty 2

## 2021-05-03 MED ORDER — IPRATROPIUM-ALBUTEROL 0.5-2.5 (3) MG/3ML IN SOLN
3.0000 mL | Freq: Two times a day (BID) | RESPIRATORY_TRACT | Status: DC
  Administered 2021-05-04 – 2021-05-05 (×3): 3 mL via RESPIRATORY_TRACT
  Filled 2021-05-03 (×3): qty 3

## 2021-05-03 MED ORDER — QUETIAPINE FUMARATE 25 MG PO TABS
25.0000 mg | ORAL_TABLET | Freq: Every day | ORAL | Status: DC
  Administered 2021-05-03 – 2021-05-04 (×2): 25 mg via ORAL
  Filled 2021-05-03 (×2): qty 1

## 2021-05-03 MED ORDER — ACETAMINOPHEN 325 MG PO TABS: 650.0000 mg | ORAL_TABLET | Freq: Four times a day (QID) | ORAL | Status: DC | PRN

## 2021-05-03 MED ORDER — TIMOLOL MALEATE 0.5 % OP SOLN
1.0000 [drp] | Freq: Two times a day (BID) | OPHTHALMIC | Status: DC
  Administered 2021-05-04 – 2021-05-05 (×4): 1 [drp] via OPHTHALMIC
  Filled 2021-05-03 (×2): qty 5

## 2021-05-03 MED ORDER — ENOXAPARIN SODIUM 40 MG/0.4ML IJ SOSY
40.0000 mg | PREFILLED_SYRINGE | INTRAMUSCULAR | Status: DC
  Administered 2021-05-03 – 2021-05-04 (×2): 40 mg via SUBCUTANEOUS
  Filled 2021-05-03 (×2): qty 0.4

## 2021-05-03 MED ORDER — IPRATROPIUM-ALBUTEROL 0.5-2.5 (3) MG/3ML IN SOLN
3.0000 mL | RESPIRATORY_TRACT | Status: DC
  Administered 2021-05-03: 21:00:00 3 mL via RESPIRATORY_TRACT
  Filled 2021-05-03: qty 3

## 2021-05-03 MED ORDER — BRIMONIDINE TARTRATE 0.2 % OP SOLN
1.0000 [drp] | Freq: Two times a day (BID) | OPHTHALMIC | Status: DC
  Administered 2021-05-04 – 2021-05-05 (×4): 1 [drp] via OPHTHALMIC
  Filled 2021-05-03 (×2): qty 5

## 2021-05-03 MED ORDER — ANASTROZOLE 1 MG PO TABS
1.0000 mg | ORAL_TABLET | Freq: Every day | ORAL | Status: DC
  Administered 2021-05-03 – 2021-05-05 (×3): 1 mg via ORAL
  Filled 2021-05-03 (×3): qty 1

## 2021-05-03 MED ORDER — BRIMONIDINE TARTRATE-TIMOLOL 0.2-0.5 % OP SOLN
1.0000 [drp] | Freq: Two times a day (BID) | OPHTHALMIC | Status: DC
  Filled 2021-05-03 (×3): qty 5

## 2021-05-03 MED ORDER — ISOSORBIDE DINITRATE 30 MG PO TABS
30.0000 mg | ORAL_TABLET | Freq: Every day | ORAL | Status: DC
  Administered 2021-05-03 – 2021-05-05 (×3): 30 mg via ORAL
  Filled 2021-05-03 (×3): qty 1

## 2021-05-03 MED ORDER — OYSTER SHELL CALCIUM/D3 500-5 MG-MCG PO TABS
1.0000 | ORAL_TABLET | Freq: Two times a day (BID) | ORAL | Status: DC
  Administered 2021-05-04 – 2021-05-05 (×2): 1 via ORAL
  Filled 2021-05-03 (×2): qty 1

## 2021-05-03 MED ORDER — FAMOTIDINE 20 MG PO TABS: 40.0000 mg | ORAL_TABLET | Freq: Every day | ORAL | Status: DC

## 2021-05-03 MED ORDER — VITAMIN D 25 MCG (1000 UNIT) PO TABS
1000.0000 [IU] | ORAL_TABLET | Freq: Every day | ORAL | Status: DC
  Administered 2021-05-04 – 2021-05-05 (×2): 1000 [IU] via ORAL
  Filled 2021-05-03 (×3): qty 1

## 2021-05-03 MED ORDER — FAMOTIDINE 20 MG PO TABS
20.0000 mg | ORAL_TABLET | Freq: Every day | ORAL | Status: DC
  Administered 2021-05-03 – 2021-05-05 (×3): 20 mg via ORAL
  Filled 2021-05-03 (×3): qty 1

## 2021-05-03 MED ORDER — GABAPENTIN 300 MG PO CAPS
300.0000 mg | ORAL_CAPSULE | Freq: Every day | ORAL | Status: DC
  Administered 2021-05-03 – 2021-05-04 (×2): 300 mg via ORAL
  Filled 2021-05-03 (×2): qty 1

## 2021-05-03 MED ORDER — HYDRALAZINE HCL 20 MG/ML IJ SOLN: 5.0000 mg | INTRAMUSCULAR | Status: DC | PRN

## 2021-05-03 MED ORDER — LORAZEPAM 2 MG/ML IJ SOLN
1.0000 mg | Freq: Once | INTRAMUSCULAR | Status: AC
  Administered 2021-05-03: 1 mg via INTRAVENOUS

## 2021-05-03 MED ORDER — GLIPIZIDE 5 MG PO TABS
5.0000 mg | ORAL_TABLET | Freq: Every day | ORAL | Status: DC
  Administered 2021-05-03: 5 mg via ORAL
  Filled 2021-05-03: qty 1

## 2021-05-03 MED ORDER — OXYCODONE-ACETAMINOPHEN 5-325 MG PO TABS
1.0000 | ORAL_TABLET | ORAL | Status: DC | PRN
  Administered 2021-05-03 – 2021-05-05 (×4): 1 via ORAL
  Filled 2021-05-03 (×4): qty 1

## 2021-05-03 MED ORDER — FUROSEMIDE 40 MG PO TABS: 20.0000 mg | ORAL_TABLET | Freq: Every day | ORAL | Status: DC | PRN

## 2021-05-03 MED ORDER — OLANZAPINE 2.5 MG PO TABS
2.5000 mg | ORAL_TABLET | Freq: Two times a day (BID) | ORAL | Status: DC | PRN
  Administered 2021-05-05: 2.5 mg via ORAL
  Filled 2021-05-03 (×2): qty 1

## 2021-05-03 MED ORDER — LATANOPROST 0.005 % OP SOLN
1.0000 [drp] | Freq: Every day | OPHTHALMIC | Status: DC
  Administered 2021-05-04 (×2): 1 [drp] via OPHTHALMIC
  Filled 2021-05-03 (×2): qty 2.5

## 2021-05-03 MED ORDER — AMLODIPINE BESYLATE 5 MG PO TABS
5.0000 mg | ORAL_TABLET | Freq: Every day | ORAL | Status: DC
  Administered 2021-05-03 – 2021-05-05 (×3): 5 mg via ORAL
  Filled 2021-05-03 (×3): qty 1

## 2021-05-03 NOTE — Progress Notes (Addendum)
Inpatient Diabetes Program Recommendations  AACE/ADA: New Consensus Statement on Inpatient Glycemic Control (2015)  Target Ranges:  Prepandial:   less than 140 mg/dL      Peak postprandial:   less than 180 mg/dL (1-2 hours)      Critically ill patients:  140 - 180 mg/dL   Lab Results  Component Value Date   GLUCAP 76 05/03/2021   HGBA1C 7.0 (H) 04/20/2021    Review of Glycemic Control  Latest Reference Range & Units 05/02/21 21:48 05/02/21 22:25 05/03/21 00:59 05/03/21 02:10  Glucose-Capillary 70 - 99 mg/dL 54 (L) 70 83 76  (L): Data is abnormally low  Diabetes history: DM2 Outpatient Diabetes medications: Glipizide 5 mg QD Current orders for Inpatient glycemic control: Glipizide 5 mg QD  Inpatient Diabetes Program Recommendations:    Please check CBGs ac/hs.  Consider discontinuing Glipizide if CBGs remain < 120 mg/dL or if not eating.  Will continue to follow while inpatient.  Thank you, Reche Dixon, RN, BSN Diabetes Coordinator Inpatient Diabetes Program [phone] (team pager from 8a-5p)

## 2021-05-03 NOTE — NC FL2 (Signed)
[place] LEVEL OF CARE SCREENING TOOL     IDENTIFICATION  Patient Name: Jessica Nash Birthdate: May 15, 1933 Sex: female Admission Date (Current Location): 05/03/2021  [facility] and [place] Number:  Engineering geologist and Address:  [facility], [address], [place], [place] [address]      Provider Number: [id number]  Attending Physician Name and Address:  Ivor Costa, MD  Relative Name and Phone Number:  Scheryl Marten (daughter) [phone]    Current Level of Care: Hospital Recommended Level of Care: [place] Prior Approval Number:    Date Approved/Denied:   PASRR Number: [id number]  Discharge Plan: SNF    Current Diagnoses: Patient Active Problem List   Diagnosis Date Noted   Acute metabolic encephalopathy 58/01/9832   Acute on chronic respiratory failure ([place]) 04/20/2021   Blind    Myalgia 06/19/2012   Blindness of both eyes 06/08/2011   Breast cancer ([place])    CONSTIPATION, CHRONIC 06/10/2010   DECREASED HEARING 08/16/2009   Acute lower UTI 07/14/2008   CAD (coronary artery disease) 07/07/2008   OSTEOARTHRITIS 06/12/2008   Myocardial infarction ([place]) 2010   Hypercapnic respiratory failure ([place]) 2010   HYPERLIPIDEMIA 02/15/2007   TUBULOVILLOUS ADENOMA, 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   DIABETES MELLITUS, TYPE II 10/09/2006   GLAUCOMA 10/09/2006   Hypertension 10/09/2006   GERD 10/09/2006   DEGENERATIVE DISC DISEASE, LUMBAR SPINE 10/09/2006   COLONIC POLYPS, HX OF 10/09/2006    Orientation RESPIRATION BLADDER Height & Weight     Self  O2 (chronic 2L) Continent Weight:   Height:     BEHAVIORAL SYMPTOMS/MOOD NEUROLOGICAL BOWEL NUTRITION STATUS      Continent Diet (see discharge summary)  AMBULATORY STATUS COMMUNICATION OF NEEDS Skin   Extensive Assist Verbally Normal                       Personal Care Assistance Level of Assistance  Bathing, Feeding, Dressing Bathing Assistance: Limited  assistance Feeding assistance: Limited assistance Dressing Assistance: Limited assistance     Functional Limitations Info  Sight, Hearing, Speech Sight Info: Impaired (blind- lost sight in her 28's) Hearing Info: Adequate Speech Info: Adequate    SPECIAL CARE FACTORS FREQUENCY  PT (By licensed PT), OT (By licensed OT)     PT Frequency: 5 times per week OT Frequency: 5 times per week            Contractures Contractures Info: Not present    Additional Factors Info  Code Status, Allergies Code Status Info: DNR Allergies Info: Lactose intolerance           Current Medications (05/03/2021):  This is the current hospital active medication list Current Facility-Administered Medications  Medication Dose Route Frequency Provider Last Rate Last Admin   amLODipine (NORVASC) tablet 5 mg  5 mg Oral Daily Rada Hay, MD   5 mg at 05/03/21 1118   anastrozole (ARIMIDEX) tablet 1 mg  1 mg Oral Daily Rada Hay, MD   1 mg at 05/03/21 1119   aspirin EC tablet 81 mg  81 mg Oral Daily Rada Hay, MD   81 mg at 05/03/21 1119   atorvastatin (LIPITOR) tablet 40 mg  40 mg Oral QHS Rada Hay, MD       brimonidine (ALPHAGAN) 0.2 % ophthalmic solution 1 drop  1 drop Both Eyes BID Rada Hay, MD       And   timolol (TIMOPTIC) 0.5 % ophthalmic solution 1  drop  1 drop Both Eyes BID Rada Hay, MD       famotidine (PEPCID) tablet 20 mg  20 mg Oral Daily Rada Hay, MD   20 mg at 05/03/21 1120   furosemide (LASIX) tablet 20 mg  20 mg Oral Daily PRN Rada Hay, MD       gabapentin (NEURONTIN) capsule 300 mg  300 mg Oral QHS Rada Hay, MD       glipiZIDE (GLUCOTROL) tablet 5 mg  5 mg Oral Daily Rada Hay, MD   5 mg at 05/03/21 1120   isosorbide dinitrate (ISORDIL) tablet 30 mg  30 mg Oral Daily Rada Hay, MD   30 mg at 05/03/21 1121   latanoprost (XALATAN) 0.005 % ophthalmic solution 1 drop  1 drop Both Eyes QHS  Rada Hay, MD       LORazepam (ATIVAN) 2 MG/ML injection            metoprolol tartrate (LOPRESSOR) tablet 100 mg  100 mg Oral BID Rada Hay, MD   100 mg at 05/03/21 1121   OLANZapine (ZYPREXA) tablet 2.5 mg  2.5 mg Oral BID PRN Sherlon Handing, NP       QUEtiapine (SEROQUEL) tablet 25 mg  25 mg Oral QHS Rada Hay, MD       Current Outpatient Medications  Medication Sig Dispense Refill   amLODipine (NORVASC) 5 MG tablet Take 5 mg by mouth daily.     anastrozole (ARIMIDEX) 1 MG tablet Take 1 mg by mouth daily.       aspirin 81 MG tablet Take 81 mg by mouth daily.       atorvastatin (LIPITOR) 40 MG tablet Take 40 mg by mouth at bedtime.     brimonidine-timolol (COMBIGAN) 0.2-0.5 % ophthalmic solution Place 1 drop into both eyes every 12 (twelve) hours.       Calcium Carbonate-Vitamin D (CALCIUM-VITAMIN D) 500-200 MG-UNIT per tablet Take 1 tablet by mouth 2 (two) times daily with a meal.       cholecalciferol (VITAMIN D3) 25 MCG (1000 UNIT) tablet Take 1,000 Units by mouth daily.     famotidine (PEPCID) 40 MG tablet Take 40 mg by mouth daily.     gabapentin (NEURONTIN) 300 MG capsule Take 300 mg by mouth at bedtime.     glipiZIDE (GLUCOTROL) 5 MG tablet TAKE 1 TABLET BY MOUTH DAILY 90 tablet 2   isosorbide dinitrate (ISORDIL) 30 MG tablet Take 30 mg by mouth daily.     latanoprost (XALATAN) 0.005 % ophthalmic solution 1 drop at bedtime.     metoprolol (LOPRESSOR) 100 MG tablet TAKE 1 TABLET BY MOUTH TWICE A DAY 60 tablet 11   QUEtiapine (SEROQUEL) 25 MG tablet Take 1 tablet (25 mg total) by mouth at bedtime. 30 tablet 0   furosemide (LASIX) 20 MG tablet Take 1 tablet (20 mg total) by mouth daily as needed for fluid or edema. 30 tablet 0   traMADol (ULTRAM) 50 MG tablet Take 25 mg by mouth every 6 (six) hours as needed.       Discharge Medications: Please see discharge summary for a list of discharge medications.  Relevant Imaging Results:  Relevant Lab  Results:   Additional Information SS# [id number]  Shelbie Hutching, RN

## 2021-05-03 NOTE — TOC Initial Note (Signed)
Transition of Care [facility]) - Initial/Assessment Note    Patient Details  Name: Jessica Nash MRN: [id number] Date of Birth: 07-13-1932  Transition of Care [facility]) CM/SW Contact:    Shelbie Hutching, RN Phone Number: 05/03/2021, 4:44 PM  Clinical Narrative:                 Patient placed under observation for confusion and right ankle fracture.  Patient recently discharged on 11/29.  RNCM was able to speak with patient's daughter via phone as patient is confused at this time.   Patient lives with daughter, she has been blind since she was in her 27's.  She does whatever she can for herself at home, she walks without assistance, can take her own medications, and feeds herself.  She has bedside commode in her room.  She is on chronic oxygen at 2L.  She was [facility] last admission and set up with [facility] for PCP services who was able to come out almost immediately per daughter's report.    PT is recommending SNF and daughter would like to try that for the patient to see if she gets better and can return home or if now go from there with placement long term.   SNF workup started.  Expected Discharge Plan: Skilled Nursing Facility Barriers to Discharge: Continued Medical Work up   Patient Goals and CMS Choice Patient states their goals for this hospitalization and ongoing recovery are:: daughter would like patient to get rehab and see how she does CMS Medicare.gov Compare Post Acute Care list provided to:: Patient Represenative (must comment) Choice offered to / list presented to : Adult Children  Expected Discharge Plan and Services Expected Discharge Plan: [place]   Discharge Planning Services: CM Consult Post Acute Care Choice: [place] Living arrangements for the past 2 months: Single Family Home                 DME Arranged: N/A DME Agency: NA       HH Arranged: NA HH Agency: NA        Prior Living  Arrangements/Services Living arrangements for the past 2 months: Single Family Home Lives with:: Adult Children Patient language and need for interpreter reviewed:: Yes Do you feel safe going back to the place where you live?: Yes      Need for Family Participation in Patient Care: Yes (Comment) Care giver support system in place?: Yes (comment) Current home services: DME (3 in 1) Criminal Activity/Legal Involvement Pertinent to Current Situation/Hospitalization: No - Comment as needed  Activities of Daily Living      Permission Sought/Granted Permission sought to share information with : Case Manager, Family Supports, Customer service manager Permission granted to share information with : Yes, Verbal Permission Granted  Share Information with NAME: Scheryl Marten (daughter) [phone]  Permission granted to share info w AGENCY: SNF's  Permission granted to share info w Relationship: daughter     Emotional Assessment Appearance:: Appears stated age Attitude/Demeanor/Rapport: Other (comment) (confused) Affect (typically observed): Agitated, Anxious Orientation: : Fluctuating Orientation (Suspected and/or reported Sundowners) Alcohol / Substance Use: Not Applicable Psych Involvement: No (comment)  Admission diagnosis:  Acute metabolic encephalopathy [T65.46] Patient Active Problem List   Diagnosis Date Noted   Acute metabolic encephalopathy 50/35/4656   Acute on chronic respiratory failure ([place]) 04/20/2021   Blind    Myalgia 06/19/2012   Blindness of both eyes 06/08/2011   Breast cancer ([place])  CONSTIPATION, CHRONIC 06/10/2010   DECREASED HEARING 08/16/2009   Acute lower UTI 07/14/2008   CAD (coronary artery disease) 07/07/2008   OSTEOARTHRITIS 06/12/2008   Myocardial infarction ([place]) 2010   Hypercapnic respiratory failure ([place]) 2010   HYPERLIPIDEMIA 02/15/2007   TUBULOVILLOUS ADENOMA, 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 [place] [address] Phone: [phone] Fax: [phone]     Social Determinants of Health (SDOH) Interventions    Readmission Risk Interventions No flowsheet data found.

## 2021-05-03 NOTE — ED Notes (Addendum)
Pt given 8 oz orange juice.  

## 2021-05-03 NOTE — H&P (Signed)
History and Physical    Jessica Nash HER:[id number] DOB: 01-Nov-1932 DOA: 05/03/2021  Referring MD/NP/PA:   PCP: System, Provider Not In   Patient coming from:  The patient is coming from home.    Chief Complaint: Altered mental status, fall, bilateral ankle pain  HPI: Jessica Nash is a 87 y.o. female with medical history significant of hypertension, hyperlipidemia, diabetes mellitus, GERD, depression, former smoker, CAD, diverticulitis, breast cancer, bilateral blindness, CKD-3A, who presents with altered mental status, fall, bilateral ankle pain.  Per her daughter at the bedside, patient fell few days ago, developed bilateral ankle pain, which seem to be severe.  Patient also becomes altered and agitated, screaming. She moves all extremities, no facial droop or slurred speech.  Per her daughter, patient does not seem to have abdominal pain or chest pain.  No active nausea, vomiting, diarrhea noted.  No active cough or respiratory distress noted.  Pt was given a dose of Zyprexa per psych recommendation, then patient calmed down.  When I saw patient in ED, she is calm, mildly confused, but is orientated x3.  ED Course: pt was found to have WBC 6.3, troponin level 25, 24, negative COVID PCR, negative urinalysis, lactic acid of 1.4, WBC 6.3, renal function at baseline, temperature normal, blood pressure 150/80, heart rate 50, 105, 74, RR 24, oxygen sat 97% on 2 L oxygen.  ABG with pH 7.44, CO2 70, O2 <31. CT of head is negative for acute intracranial abnormalities.  X-ray showed bimalleolar fracture with some widening of the tibiotalar joint without true dislocation.  Patient is placed on MedSurg bed for patient. Dr. Harlow Mares of Ortho is consulted.  Review of Systems: Could not be reviewed accurately due to altered mental status   Allergy:  Allergies  Allergen Reactions   Lactose Intolerance (Gi) Nausea And Vomiting    Past Medical History:  Diagnosis Date   Blind 2011   Breast cancer  ([place])    Breast cancer metastasized to axillary lymph node ([place]) 4/12   Papillary carcinoma. 2/3 sentinel nodes positive. Dr Bary Castilla   Breast cyst    CAD (coronary artery disease)    Cellulitis and abscess of trunk 2013   Degenerative disc disease    Diabetes mellitus    Diverticulitis    GERD (gastroesophageal reflux disease)    Glaucoma 2010   blindness d/t glaucoma   Hx of colonic polyps    Hx of decompressive lumbar laminectomy    3 Level lumbar decompressive laminotomies with  foraminotomies 02/06   Hyperlipidemia    Hypertension    Myocardial infarction [facility]) 2010   Personal history of tobacco use, presenting hazards to health     Past Surgical History:  Procedure Laterality Date   ABDOMINAL HYSTERECTOMY  1964   partial   ANGIOPLASTY     2/10 Angioplasty/stent-RCA---------Dr Arida   BACK SURGERY     BREAST BIOPSY Left 2012   BREAST SURGERY Right 2011   biopsy   COLON SURGERY  2006   COLONOSCOPY  2008,2011   Dr. Candace Cruise [facility]   CORONARY ANGIOPLASTY WITH STENT PLACEMENT  2010   done by Annia Belt MD   [place]  01/03-03/03   MASTECTOMY  4/12   Dr Kris Mouton RT after   MASTECTOMY Right 2012    Social History:  reports that she quit smoking about 13 years ago. She has never used smokeless tobacco. She reports that she does not drink alcohol and does not use  drugs.  Family History:  Family History  Problem Relation Age of Onset   Cancer Daughter        colon cancer   Cancer Son        colon cancer   Cancer Other        unknoen family members with ovarian and breast cancers     Prior to Admission medications   Medication Sig Start Date End Date Taking? Authorizing Provider  amLODipine (NORVASC) 5 MG tablet Take 5 mg by mouth daily.   Yes [provider]  anastrozole (ARIMIDEX) 1 MG tablet Take 1 mg by mouth daily.     Yes [provider]  aspirin 81 MG tablet Take 81 mg by mouth daily.     Yes [provider]  atorvastatin (LIPITOR) 40 MG tablet Take 40 mg by mouth at bedtime.   Yes [provider]  brimonidine-timolol (COMBIGAN) 0.2-0.5 % ophthalmic solution Place 1 drop into both eyes every 12 (twelve) hours.     Yes [provider]  Calcium Carbonate-Vitamin D (CALCIUM-VITAMIN D) 500-200 MG-UNIT per tablet Take 1 tablet by mouth 2 (two) times daily with a meal.     Yes [provider]  cholecalciferol (VITAMIN D3) 25 MCG (1000 UNIT) tablet Take 1,000 Units by mouth daily.   Yes [provider]  famotidine (PEPCID) 40 MG tablet Take 40 mg by mouth daily.   Yes [provider]  gabapentin (NEURONTIN) 300 MG capsule Take 300 mg by mouth at bedtime.   Yes [provider]  glipiZIDE (GLUCOTROL) 5 MG tablet TAKE 1 TABLET BY MOUTH DAILY 07/11/12  Yes Viviana Simpler I, MD  isosorbide dinitrate (ISORDIL) 30 MG tablet Take 30 mg by mouth daily.   Yes [provider]  latanoprost (XALATAN) 0.005 % ophthalmic solution 1 drop at bedtime.   Yes [provider]  metoprolol (LOPRESSOR) 100 MG tablet TAKE 1 TABLET BY MOUTH TWICE A DAY 06/25/12  Yes Venia Carbon, MD  QUEtiapine (SEROQUEL) 25 MG tablet Take 1 tablet (25 mg total) by mouth at bedtime. 04/26/21 05/26/21 Yes Gherghe, Vella Redhead, MD  furosemide (LASIX) 20 MG tablet Take 1 tablet (20 mg total) by mouth daily as needed for fluid or edema. 04/26/21 04/26/22  Caren Griffins, MD  traMADol (ULTRAM) 50 MG tablet Take 25 mg by mouth every 6 (six) hours as needed.    [provider]    Physical Exam: Vitals:   05/03/21 0734 05/03/21 0809 05/03/21 1117 05/03/21 1318  BP: (!) 147/113 (!) 125/103 (!) 151/92 (!) 150/80  Pulse: 95  (!) 105 74  Resp: 20  (!) 24 20  Temp:   98.7 F (37.1 C) 98.4 F (36.9 C)  TempSrc:   Oral Oral  SpO2: 96%  93% 97%   General: Not in acute distress HEENT:       Eyes: no scleral icterus.  Patient has bilateral blindness.       ENT: No  discharge from the ears and nose.  Neck: No JVD, no bruit, no mass felt. Heme: No neck lymph node enlargement. Cardiac: S1/S2, RRR, No murmurs, No gallops or rubs. Respiratory: No rales, wheezing, rhonchi or rubs. GI: Soft, nondistended, nontender, no rebound pain, no organomegaly, BS present. GU: No hematuria Ext: No pitting leg edema bilaterally. 1+DP/PT pulse bilaterally. Musculoskeletal: has tenderness in both ankles Skin: No rashes.  Neuro: Mildly confused, calm, oriented x3, cranial nerves II-XII grossly intact, moves all extremities. Psych: Patient is  calm currently.  Labs on Admission: I have personally reviewed following labs and imaging studies  CBC: Recent Labs  Lab 05/02/21 1928  WBC 6.3  HGB 12.9  HCT 42.5  MCV 101.0*  PLT 160   Basic Metabolic Panel: Recent Labs  Lab 05/02/21 1928  NA 138  K 4.5  CL 92*  CO2 39*  GLUCOSE 66*  BUN 22  CREATININE 1.25*  CALCIUM 10.0   GFR: Estimated Creatinine Clearance: 33.2 mL/min (A) (by C-G formula based on SCr of 1.25 mg/dL (H)). Liver Function Tests: No results for input(s): AST, ALT, ALKPHOS, BILITOT, PROT, ALBUMIN in the last 168 hours. No results for input(s): LIPASE, AMYLASE in the last 168 hours. No results for input(s): AMMONIA in the last 168 hours. Coagulation Profile: No results for input(s): INR, PROTIME in the last 168 hours. Cardiac Enzymes: No results for input(s): CKTOTAL, CKMB, CKMBINDEX, TROPONINI in the last 168 hours. BNP (last 3 results) No results for input(s): PROBNP in the last 8760 hours. HbA1C: No results for input(s): HGBA1C in the last 72 hours. CBG: Recent Labs  Lab 05/02/21 2225 05/03/21 0059 05/03/21 0210 05/03/21 1334 05/03/21 1754  GLUCAP 70 83 76 101* 76   Lipid Profile: No results for input(s): CHOL, HDL, LDLCALC, TRIG, CHOLHDL, LDLDIRECT in the last 72 hours. Thyroid Function Tests: No results for input(s): TSH, T4TOTAL, FREET4, T3FREE, THYROIDAB in the last 72  hours. Anemia Panel: No results for input(s): VITAMINB12, FOLATE, FERRITIN, TIBC, IRON, RETICCTPCT in the last 72 hours. Urine analysis:    Component Value Date/Time   COLORURINE YELLOW (A) 05/03/2021 0240   APPEARANCEUR CLEAR (A) 05/03/2021 0240   LABSPEC 1.014 05/03/2021 0240   PHURINE 7.0 05/03/2021 0240   GLUCOSEU NEGATIVE 05/03/2021 0240   HGBUR NEGATIVE 05/03/2021 0240   HGBUR large 07/14/2008 0928   BILIRUBINUR NEGATIVE 05/03/2021 0240   KETONESUR NEGATIVE 05/03/2021 0240   PROTEINUR NEGATIVE 05/03/2021 0240   UROBILINOGEN 0.2 07/14/2008 0928   NITRITE NEGATIVE 05/03/2021 0240   LEUKOCYTESUR NEGATIVE 05/03/2021 0240   Sepsis Labs: @LABRCNTIP (procalcitonin:4,lacticidven:4) ) Recent Results (from the past 240 hour(s))  Resp Panel by RT-PCR (Flu A&B, Covid) Nasopharyngeal Swab     Status: None   Collection Time: 05/03/21  3:50 AM   Specimen: Nasopharyngeal Swab; Nasopharyngeal(NP) swabs in vial transport medium  Result Value Ref Range Status   SARS Coronavirus 2 by RT PCR NEGATIVE NEGATIVE Final    Comment: (NOTE) SARS-CoV-2 target nucleic acids are NOT DETECTED.  The SARS-CoV-2 RNA is generally detectable in upper respiratory specimens during the acute phase of infection. The lowest concentration of SARS-CoV-2 viral copies this assay can detect is 138 copies/mL. A negative result does not preclude SARS-Cov-2 infection and should not be used as the sole basis for treatment or other patient management decisions. A negative result may occur with  improper specimen collection/handling, submission of specimen other than nasopharyngeal swab, presence of viral mutation(s) within the areas targeted by this assay, and inadequate number of viral copies(<138 copies/mL). A negative result must be combined with clinical observations, patient history, and epidemiological information. The expected result is Negative.  Fact Sheet for Patients:   EntrepreneurPulse.com.au  Fact Sheet for Healthcare Providers:  IncredibleEmployment.be  This test is no t yet approved or cleared by the [place] FDA and  has been authorized for detection and/or diagnosis of SARS-CoV-2 by FDA under an Emergency Use Authorization (EUA). This EUA will remain  in effect (meaning this test can be used) for the duration  of the COVID-19 declaration under Section 564(b)(1) of the Act, 21 U.S.C.section 360bbb-3(b)(1), unless the authorization is terminated  or revoked sooner.       Influenza A by PCR NEGATIVE NEGATIVE Final   Influenza B by PCR NEGATIVE NEGATIVE Final    Comment: (NOTE) The Xpert Xpress SARS-CoV-2/FLU/RSV plus assay is intended as an aid in the diagnosis of influenza from Nasopharyngeal swab specimens and should not be used as a sole basis for treatment. Nasal washings and aspirates are unacceptable for Xpert Xpress SARS-CoV-2/FLU/RSV testing.  Fact Sheet for Patients: EntrepreneurPulse.com.au  Fact Sheet for Healthcare Providers: IncredibleEmployment.be  This test is not yet approved or cleared by the [place] FDA and has been authorized for detection and/or diagnosis of SARS-CoV-2 by FDA under an Emergency Use Authorization (EUA). This EUA will remain in effect (meaning this test can be used) for the duration of the COVID-19 declaration under Section 564(b)(1) of the Act, 21 U.S.C. section 360bbb-3(b)(1), unless the authorization is terminated or revoked.  Performed at [facility], [address] [place],  [address]      Radiological Exams on Admission: DG Ankle Complete Right  Result Date: 05/03/2021 CLINICAL DATA:  Recent fall 2 days ago with ankle pain, initial encounter EXAM: RIGHT ANKLE - COMPLETE 3+ VIEW COMPARISON:  None. FINDINGS: Oblique fracture through the distal fibula is noted with minimal displacement. Small  bony density is noted adjacent to the tip of the medial malleolus which may represent mild avulsion. Widening of the tibiotalar joint is noted without significant dislocation. No other abnormality is noted. IMPRESSION: Bimalleolar fracture with some widening of the tibiotalar joint without true dislocation. Electronically Signed   By: Inez Catalina M.D.   On: 05/03/2021 02:30   CT Head Wo Contrast  Result Date: 05/03/2021 CLINICAL DATA:  Generalized weakness EXAM: CT HEAD WITHOUT CONTRAST TECHNIQUE: Contiguous axial images were obtained from the base of the skull through the vertex without intravenous contrast. COMPARISON:  04/20/2021 FINDINGS: Brain: There is no mass, hemorrhage or extra-axial collection. There is generalized atrophy without lobar predilection. Hypodensity of the white matter is most commonly associated with chronic microvascular disease. Vascular: No abnormal hyperdensity of the major intracranial arteries or dural venous sinuses. No intracranial atherosclerosis. Skull: The visualized skull base, calvarium and extracranial soft tissues are normal. Sinuses/Orbits: No fluid levels or advanced mucosal thickening of the visualized paranasal sinuses. No mastoid or middle ear effusion. Left globe calcification and volume loss. IMPRESSION: Generalized atrophy and chronic microvascular ischemia without acute intracranial abnormality. Electronically Signed   By: Ulyses Jarred M.D.   On: 05/03/2021 03:51     EKG: I have personally reviewed.  Sinus rhythm, QTC 478, bifascicular block, early R wave progression  Assessment/Plan Principal Problem:   Acute metabolic encephalopathy Active Problems:   Type II diabetes mellitus with renal manifestations (HCC)   HLD (hyperlipidemia)   Hypertension   CAD (coronary artery disease)   Breast cancer (HCC)   Blind   Hypercapnic respiratory failure (HCC)   Elevated troponin   CKD (chronic kidney disease), stage IIIa   Fall   Ankle fracture,  bimalleolar, closed   Acute metabolic encephalopathy: Etiology is not clear.  CT head is negative.  No focal deficit on physical examination.  Differential diagnosis include delirium, severe pain due to ankle fracture, early stage of dementia.  Per her daughter, patient had history of hypercapnic respiratory failure in the past.  VBG today showed pH 7.44, CO2 70, which is not severely elevated,  but may have contributed partially  -Placed on MedSurg bed for observation, -Consulted psychiatry -As needed Zyprexa per psych -Frequent neuro check -TOC consult for rehab placement  Type II diabetes mellitus with renal manifestations [facility]): Recent A1c 7.0.  Patient taking glipizide -Sliding scale insulin  HLD (hyperlipidemia) -Lipitor  Hypertension -IV hydralazine as needed -Amlodipine, metoprolol  CAD (coronary artery disease) and elevated troponin: Troponin is minimally elevated, 25, 24, 22, likely not specifically elevated.  No chest pain -Aspirin, Lipitor, imdur  Hx of Breast cancer (HCC) -Continue anastrozole  Blind -Fall precaution  Hypercapnic respiratory failure (HCC) -Bronchodilators  CKD (chronic kidney disease), stage IIIa: Stable. -f/u by BMP  Fall and ankle fracture, bimalleolar, closed: Patient has a bilateral ankle fracture.  Daughter does not want patient to do any surgery.  Dr. Exie Parody is consulted -PT/OT when able to. -Rehab placement     DVT ppx: SQ Lovenox Code Status: DNR per her daughter Family Communication: Yes, patient's  at bed side.  Disposition Plan:  Anticipate discharge to rehab Consults called:  Dr. Harlow Mares of ortho Admission status and Level of care: Telemetry Medical:    for obs    Status is: Observation  The patient remains OBS appropriate and will d/c before 2 midnights.         Date of Service 05/03/2021    Ivor Costa [facility]   If 7PM-7AM, please contact night-coverage [email or web address] 05/03/2021, 7:30 PM

## 2021-05-03 NOTE — ED Notes (Signed)
Pt in bed, pt appears to have pulled the iv out of her L hand, iv with cath intact found on floor, no iv found in L hand, dressing placed, pt in bed with eyes closed, resps even and unlabored, pt on 3L via [place].

## 2021-05-03 NOTE — ED Notes (Signed)
Pt in bed, warm blankets given.  Pt requests food.

## 2021-05-03 NOTE — ED Provider Notes (Signed)
3:59 PM patient was seen by psychiatry but they reported that she was sleeping when they tried to see here.  Patient has been screaming and making noises since me being here for the past hour.  Seems very agitated.  PT recommended SNF placement.  Especially given the new ankle fracture may be she is having pain that is causing her to be more altered versus some underlying dementia.  Regardless I do not feel like she would be easy to get her into SNF due to her altered mental status and I feel like it be best to admit her to medicine to make sure that she is stable prior to going to a SNF.  Also consulted orthopedics per hospitalist request.  I have attempted to call the daughter but she has not picked up.  I do not feel that this would even be a safe discharge home with the way patient is acting and with the new fractures it would make her nonweightbearing and PT recs for SNF.     Vanessa [place], MD 05/03/21 [phone]

## 2021-05-03 NOTE — Consult Note (Signed)
[facility] Face-to-Face Psychiatry Consult   Reason for Consult:  AMS, agitation Referring Physician:  EDP Patient Identification: Jessica Nash MRN:  [id number] Principal Diagnosis: Acute metabolic encephalopathy Diagnosis:  Principal Problem:   Acute metabolic encephalopathy Active Problems:   Type II diabetes mellitus with renal manifestations ([place])   HLD (hyperlipidemia)   Hypertension   CAD (coronary artery disease)   Breast cancer (HCC)   Blind   Elevated troponin   CKD (chronic kidney disease), stage IIIa   Fall   Ankle fracture, bimalleolar, closed   Total Time spent with patient: 45 minutes  Subjective:  "Well, to tell you  the truth I feel better" Jessica Nash is a 87 y.o. female patient admitted with Altered mental status.  HPI:  Patient seen. Chart reviewed. Patient's daughter was at bedside and reported that patient waxes and wanes.  At times she will be her normal self with no altered mental status, and good mobility.  Other times she will have jerky movements and loose limbs.  Patient states that she fell over the last few days and that is how she injured her ankle.  Daughter states that she was convinced that patient had another urinary tract infection, but according to the record she does not.  Patient was able to talk to writer in clear coherent manner.  Patient is oriented to self, her daughter, place, time, and situation.  Patient is calm and cooperative at the moment and has been over the last hours that daughter has been with her.  The plan is to admit patient to medical floor to allow rest and healing of her right ankle.  Social work is attempting to find placement for rehab facility.  Daughter states that her desire is that patient be returned home after rehab.  Daughter denies any psychiatric history for patient.  Daughter was concerned about possible dementia.  Discussion regarding altered mental status in the elderly population for various reasons versus dementia.  Patient  recently moved here from [place], within last few months,which may have some impact on behavior. However, daughter states she noticed changes in patient his mentation prior to patient's move.  Patient does not require inpatient psychiatric hospitalization.  Zyprexa ordered for agitation as needed.  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 Dr Bary Castilla   Breast cyst    CAD (coronary artery disease)    Cellulitis and abscess of trunk 2013   Degenerative disc disease    Diabetes mellitus    Diverticulitis    GERD (gastroesophageal reflux disease)    Glaucoma 2010   blindness d/t glaucoma   Hx of colonic polyps    Hx of decompressive lumbar laminectomy    3 Level lumbar decompressive laminotomies with  foraminotomies 02/06   Hyperlipidemia    Hypertension    Myocardial infarction [facility]) 2010   Personal history of tobacco use, presenting hazards to health     Past Surgical History:  Procedure Laterality Date   ABDOMINAL HYSTERECTOMY  1964   partial   ANGIOPLASTY     2/10 Angioplasty/stent-RCA---------Dr Arida   BACK SURGERY     BREAST BIOPSY Left 2012   BREAST SURGERY Right  2011   biopsy   COLON SURGERY  2006   COLONOSCOPY  2008,2011   Dr. Candace Cruise [facility]   CORONARY ANGIOPLASTY WITH STENT PLACEMENT  2010   done by Annia Belt MD   EYE SURGERY     GLAUCOMA SURGERY  01/03-03/03   MASTECTOMY  4/12   Dr Kris Mouton RT after   MASTECTOMY Right 2012   Family History:  Family History  Problem Relation Age of Onset   Cancer Daughter        colon cancer   Cancer Son        colon cancer   Cancer Other        unknoen family members with ovarian and breast cancers   Family  Psychiatric  History: unknown Social History:  Social History   Substance and Sexual Activity  Alcohol Use No     Social History   Substance and Sexual Activity  Drug Use No    Social History   Socioeconomic History   Marital status: Widowed    Spouse name: Not on file   Number of children: 5   Years of education: Not on file   Highest education level: Not on file  Occupational History   Occupation: retired as Sport and exercise psychologist    Employer: RETIRED  Tobacco Use   Smoking status: Former    Years: 20.00    Types: Cigarettes    Quit date: 05/30/2007    Years since quitting: 13.9   Smokeless tobacco: Never  Substance and Sexual Activity   Alcohol use: No   Drug use: No   Sexual activity: Not on file  Other Topics Concern   Not on file  Social History Narrative   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   Social Determinants of Health   Financial Resource Strain: Not on file  Food Insecurity: Not on file  Transportation Needs: Not on file  Physical Activity: Not on file  Stress: Not on file  Social Connections: Not on file   Additional Social History:    Allergies:   Allergies  Allergen Reactions   Lactose Intolerance (Gi) Nausea And Vomiting    Labs:  Results for orders placed or performed during the hospital encounter of 05/03/21 (from the past 48 hour(s))  Basic metabolic panel     Status: Abnormal   Collection Time: 05/02/21  7:28 PM  Result Value Ref Range   Sodium 138 135 - 145 mmol/L   Potassium 4.5 3.5 - 5.1 mmol/L   Chloride 92 (L) 98 - 111 mmol/L   CO2 39 (H) 22 - 32 mmol/L   Glucose, Bld 66 (L) 70 - 99 mg/dL    Comment: Glucose reference range applies only to samples taken after fasting for at least 8 hours.   BUN 22 8 - 23 mg/dL   Creatinine, Ser 1.25 (H) 0.44 - 1.00 mg/dL   Calcium 10.0 8.9 - 10.3 mg/dL   GFR, Estimated 42 (L) >60 mL/min    Comment: (NOTE) Calculated using the CKD-EPI Creatinine Equation (2021)    Anion gap 7 5 - 15    Comment:  Performed at [facility], [address] [place], [place] [address]  CBC     Status: Abnormal   Collection Time: 05/02/21  7:28 PM  Result Value Ref Range   WBC 6.3 4.0 - 10.5 K/uL   RBC 4.21 3.87 - 5.11 MIL/uL   Hemoglobin 12.9 12.0 - 15.0 g/dL   HCT 42.5 36.0 - 46.0 %   MCV 101.0 (  H) 80.0 - 100.0 fL   MCH 30.6 26.0 - 34.0 pg   MCHC 30.4 30.0 - 36.0 g/dL   RDW 14.5 11.5 - 15.5 %   Platelets 253 150 - 400 K/uL   nRBC 0.0 0.0 - 0.2 %    Comment: Performed at [facility], [place], [place] [address]  Troponin I (High Sensitivity)     Status: Abnormal   Collection Time: 05/02/21  7:28 PM  Result Value Ref Range   Troponin I (High Sensitivity) 25 (H) <18 ng/L    Comment: (NOTE) Elevated high sensitivity troponin I (hsTnI) values and significant  changes across serial measurements may suggest ACS but many other  chronic and acute conditions are known to elevate hsTnI results.  Refer to the "Links" section for chest pain algorithms and additional  guidance. Performed at [facility], [address] [place], [place] [address]   CBG monitoring, ED     Status: Abnormal   Collection Time: 05/02/21  9:48 PM  Result Value Ref Range   Glucose-Capillary 54 (L) 70 - 99 mg/dL    Comment: Glucose reference range applies only to samples taken after fasting for at least 8 hours.  CBG monitoring, ED     Status: None   Collection Time: 05/02/21 10:25 PM  Result Value Ref Range   Glucose-Capillary 70 70 - 99 mg/dL    Comment: Glucose reference range applies only to samples taken after fasting for at least 8 hours.   Comment 1 Notify RN   CBG monitoring, ED     Status: None   Collection Time: 05/03/21 12:59 AM  Result Value Ref Range   Glucose-Capillary 83 70 - 99 mg/dL    Comment: Glucose reference range applies only to samples taken after fasting for at least 8 hours.  CBG monitoring, ED     Status: None   Collection Time: 05/03/21  2:10 AM   Result Value Ref Range   Glucose-Capillary 76 70 - 99 mg/dL    Comment: Glucose reference range applies only to samples taken after fasting for at least 8 hours.   Comment 1 Notify RN   Lactic acid, plasma     Status: None   Collection Time: 05/03/21  2:38 AM  Result Value Ref Range   Lactic Acid, Venous 1.4 0.5 - 1.9 mmol/L    Comment: Performed at [facility] [address], [place], [place] [address]  Troponin I (High Sensitivity)     Status: Abnormal   Collection Time: 05/03/21  2:38 AM  Result Value Ref Range   Troponin I (High Sensitivity) 24 (H) <18 ng/L    Comment: (NOTE) Elevated high sensitivity troponin I (hsTnI) values and significant  changes across serial measurements may suggest ACS but many other  chronic and acute conditions are known to elevate hsTnI results.  Refer to the "Links" section for chest pain algorithms and additional  guidance. Performed at [facility], [address] [place],  [address]   Blood gas, venous     Status: Abnormal   Collection Time: 05/03/21  2:38 AM  Result Value Ref Range   pH, Ven 7.44 (H) 7.250 - 7.430   pCO2, Ven 70 (H) 44.0 - 60.0 mmHg   pO2, Ven <31.0 (LL) 32.0 - 45.0 mmHg   Bicarbonate 47.5 (H) 20.0 - 28.0 mmol/L   Acid-Base Excess 19.1 (H) 0.0 - 2.0 mmol/L   O2 Saturation 55.8 %   Patient temperature 37.0  Collection site VEIN    Sample type VENOUS     Comment: Performed at [facility], [address] [place], [place] [address]  Urinalysis, Routine w reflex microscopic     Status: Abnormal   Collection Time: 05/03/21  2:40 AM  Result Value Ref Range   Color, Urine YELLOW (A) YELLOW   APPearance CLEAR (A) CLEAR   Specific Gravity, Urine 1.014 1.005 - 1.030   pH 7.0 5.0 - 8.0   Glucose, UA NEGATIVE NEGATIVE mg/dL   Hgb urine dipstick NEGATIVE NEGATIVE   Bilirubin Urine NEGATIVE NEGATIVE   Ketones, ur NEGATIVE NEGATIVE mg/dL   Protein, ur NEGATIVE NEGATIVE mg/dL   Nitrite  NEGATIVE NEGATIVE   Leukocytes,Ua NEGATIVE NEGATIVE    Comment: Performed at [facility], [address] [place], [place] [address]  Resp Panel by RT-PCR (Flu A&B, Covid) Nasopharyngeal Swab     Status: None   Collection Time: 05/03/21  3:50 AM   Specimen: Nasopharyngeal Swab; Nasopharyngeal(NP) swabs in vial transport medium  Result Value Ref Range   SARS Coronavirus 2 by RT PCR NEGATIVE NEGATIVE    Comment: (NOTE) SARS-CoV-2 target nucleic acids are NOT DETECTED.  The SARS-CoV-2 RNA is generally detectable in upper respiratory specimens during the acute phase of infection. The lowest concentration of SARS-CoV-2 viral copies this assay can detect is 138 copies/mL. A negative result does not preclude SARS-Cov-2 infection and should not be used as the sole basis for treatment or other patient management decisions. A negative result may occur with  improper specimen collection/handling, submission of specimen other than nasopharyngeal swab, presence of viral mutation(s) within the areas targeted by this assay, and inadequate number of viral copies(<138 copies/mL). A negative result must be combined with clinical observations, patient history, and epidemiological information. The expected result is Negative.  Fact Sheet for Patients:  EntrepreneurPulse.com.au  Fact Sheet for Healthcare Providers:  IncredibleEmployment.be  This test is no t yet approved or cleared by the [place] FDA and  has been authorized for detection and/or diagnosis of SARS-CoV-2 by FDA under an Emergency Use Authorization (EUA). This EUA will remain  in effect (meaning this test can be used) for the duration of the COVID-19 declaration under Section 564(b)(1) of the Act, 21 U.S.C.section 360bbb-3(b)(1), unless the authorization is terminated  or revoked sooner.       Influenza A by PCR NEGATIVE NEGATIVE   Influenza B by PCR NEGATIVE NEGATIVE    Comment:  (NOTE) The Xpert Xpress SARS-CoV-2/FLU/RSV plus assay is intended as an aid in the diagnosis of influenza from Nasopharyngeal swab specimens and should not be used as a sole basis for treatment. Nasal washings and aspirates are unacceptable for Xpert Xpress SARS-CoV-2/FLU/RSV testing.  Fact Sheet for Patients: EntrepreneurPulse.com.au  Fact Sheet for Healthcare Providers: IncredibleEmployment.be  This test is not yet approved or cleared by the [place] FDA and has been authorized for detection and/or diagnosis of SARS-CoV-2 by FDA under an Emergency Use Authorization (EUA). This EUA will remain in effect (meaning this test can be used) for the duration of the COVID-19 declaration under Section 564(b)(1) of the Act, 21 U.S.C. section 360bbb-3(b)(1), unless the authorization is terminated or revoked.  Performed at [facility], [address] [place], [place] [address]   CBG monitoring, ED     Status: Abnormal   Collection Time: 05/03/21  1:34 PM  Result Value Ref Range   Glucose-Capillary 101 (H) 70 - 99 mg/dL    Comment: Glucose reference  range applies only to samples taken after fasting for at least 8 hours.  CBG monitoring, ED     Status: None   Collection Time: 05/03/21  5:54 PM  Result Value Ref Range   Glucose-Capillary 76 70 - 99 mg/dL    Comment: Glucose reference range applies only to samples taken after fasting for at least 8 hours.    Current Facility-Administered Medications  Medication Dose Route Frequency Provider Last Rate Last Admin   acetaminophen (TYLENOL) tablet 650 mg  650 mg Oral Q6H PRN Ivor Costa, MD       amLODipine (NORVASC) tablet 5 mg  5 mg Oral Daily Ivor Costa, MD   5 mg at 05/03/21 1118   anastrozole (ARIMIDEX) tablet 1 mg  1 mg Oral Daily Ivor Costa, MD   1 mg at 05/03/21 1119   aspirin EC tablet 81 mg  81 mg Oral Daily Ivor Costa, MD   81 mg at 05/03/21 1119   atorvastatin (LIPITOR) tablet 40  mg  40 mg Oral QHS Ivor Costa, MD       brimonidine (ALPHAGAN) 0.2 % ophthalmic solution 1 drop  1 drop Both Eyes BID Ivor Costa, MD       And   timolol (TIMOPTIC) 0.5 % ophthalmic solution 1 drop  1 drop Both Eyes BID Ivor Costa, MD       enoxaparin (LOVENOX) injection 40 mg  40 mg Subcutaneous Q24H Ivor Costa, MD       famotidine (PEPCID) tablet 20 mg  20 mg Oral Daily Ivor Costa, MD   20 mg at 05/03/21 1120   gabapentin (NEURONTIN) capsule 300 mg  300 mg Oral QHS Ivor Costa, MD       hydrALAZINE (APRESOLINE) injection 5 mg  5 mg Intravenous Q2H PRN Ivor Costa, MD       insulin aspart (novoLOG) injection 0-9 Units  0-9 Units Subcutaneous Q4H Ivor Costa, MD       isosorbide dinitrate (ISORDIL) tablet 30 mg  30 mg Oral Daily Ivor Costa, MD   30 mg at 05/03/21 1121   latanoprost (XALATAN) 0.005 % ophthalmic solution 1 drop  1 drop Both Eyes QHS Ivor Costa, MD       metoprolol tartrate (LOPRESSOR) tablet 100 mg  100 mg Oral BID Ivor Costa, MD   100 mg at 05/03/21 1121   morphine 2 MG/ML injection 0.5 mg  0.5 mg Intravenous Q4H PRN Ivor Costa, MD       OLANZapine (ZYPREXA) tablet 2.5 mg  2.5 mg Oral BID PRN Ivor Costa, MD       ondansetron [facility]) injection 4 mg  4 mg Intravenous Q8H PRN Ivor Costa, MD       oxyCODONE-acetaminophen (PERCOCET/ROXICET) 5-325 MG per tablet 1 tablet  1 tablet Oral Q4H PRN Ivor Costa, MD       QUEtiapine (SEROQUEL) tablet 25 mg  25 mg Oral QHS Ivor Costa, MD       Current Outpatient Medications  Medication Sig Dispense Refill   amLODipine (NORVASC) 5 MG tablet Take 5 mg by mouth daily.     anastrozole (ARIMIDEX) 1 MG tablet Take 1 mg by mouth daily.       aspirin 81 MG tablet Take 81 mg by mouth daily.       atorvastatin (LIPITOR) 40 MG tablet Take 40 mg by mouth at bedtime.     brimonidine-timolol (COMBIGAN) 0.2-0.5 % ophthalmic solution Place 1 drop into both eyes every 12 (twelve) hours.  Calcium Carbonate-Vitamin D (CALCIUM-VITAMIN D) 500-200 MG-UNIT per  tablet Take 1 tablet by mouth 2 (two) times daily with a meal.       cholecalciferol (VITAMIN D3) 25 MCG (1000 UNIT) tablet Take 1,000 Units by mouth daily.     famotidine (PEPCID) 40 MG tablet Take 40 mg by mouth daily.     gabapentin (NEURONTIN) 300 MG capsule Take 300 mg by mouth at bedtime.     glipiZIDE (GLUCOTROL) 5 MG tablet TAKE 1 TABLET BY MOUTH DAILY 90 tablet 2   isosorbide dinitrate (ISORDIL) 30 MG tablet Take 30 mg by mouth daily.     latanoprost (XALATAN) 0.005 % ophthalmic solution 1 drop at bedtime.     metoprolol (LOPRESSOR) 100 MG tablet TAKE 1 TABLET BY MOUTH TWICE A DAY 60 tablet 11   QUEtiapine (SEROQUEL) 25 MG tablet Take 1 tablet (25 mg total) by mouth at bedtime. 30 tablet 0   furosemide (LASIX) 20 MG tablet Take 1 tablet (20 mg total) by mouth daily as needed for fluid or edema. 30 tablet 0   traMADol (ULTRAM) 50 MG tablet Take 25 mg by mouth every 6 (six) hours as needed.      Musculoskeletal: Strength & Muscle Tone: within normal limits Gait & Station:  not observed, pt not ambulatory, ankle fx Patient leans: N/A  Psychiatric Specialty Exam:  Presentation  General Appearance: Appropriate for Environment Eye Contact:Absent (pt blind, had eyes closed) Speech:Clear and Coherent Speech Volume:Normal Handedness:No data recorded  Mood and Affect  Mood:Euthymic Affect:Congruent  Thought Process  Thought Processes:Coherent Descriptions of Associations:Intact Orientation:Full (Time, Place and Person) Thought Content:WDL History of Schizophrenia/Schizoaffective disorder:No data recorded Duration of Psychotic Symptoms:No data recorded Hallucinations:Hallucinations: None Ideas of Reference:None Suicidal Thoughts:Suicidal Thoughts: No Homicidal Thoughts:Homicidal Thoughts: No  Sensorium  Memory:Immediate Fair Judgment:Fair Insight:Fair  Executive Functions  Concentration:Fair Attention Span:[place]  Psychomotor Activity  Psychomotor Activity:Psychomotor Activity: Normal  Assets  Assets:Communication Skills; Financial Resources/Insurance; Housing; Resilience; Social Support  Sleep  Sleep:Sleep: Fair  Physical Exam: Physical Exam Vitals and nursing note reviewed.  HENT:     Head: Normocephalic.     Nose: No congestion or rhinorrhea.  Eyes:     General:        Right eye: No discharge.        Left eye: No discharge.  Cardiovascular:     Rate and Rhythm: Normal rate.  Pulmonary:     Effort: Pulmonary effort is normal.  Musculoskeletal:        General: Normal range of motion.     Cervical back: Normal range of motion.  Skin:    General: Skin is dry.  Neurological:     Mental Status: She is alert and oriented to person, place, and time.  Psychiatric:        Attention and Perception: Attention normal.        Mood and Affect: Mood normal.        Speech: Speech normal.        Behavior: Behavior normal.        Thought Content: Thought content normal.        Cognition and Memory: Cognition normal.     Comments: Mild intermittent confusion    Review of Systems  Musculoskeletal:  Positive for falls (rt ankle fx).  Psychiatric/Behavioral: Negative.    All other systems reviewed and are negative. Blood pressure (!) 150/80, pulse 74, temperature 98.4 F (36.9 C), temperature source Oral, resp. rate 20,  SpO2 97 %. There is no height or weight on file to calculate BMI.  Treatment Plan Summary: Daily contact with patient to assess and evaluate symptoms and progress in treatment, Medication management, and Plan 87 year old female being admitted to medical floor for management of ankle fx and AMS. Ordered Zyprexa 2.5 mg twice daily as needed for agitation.  Reviewed with admitting MD and EDP  Disposition: No evidence of imminent risk to self or others at present.   Patient does not meet criteria for psychiatric inpatient admission.  Sherlon Handing, NP 05/03/2021 5:59 PM

## 2021-05-03 NOTE — ED Provider Notes (Signed)
[facility] Emergency Department Provider Note  ____________________________________________  Time seen: Approximately 2:11 AM  I have reviewed the triage vital signs and the nursing notes.   HISTORY  Chief Complaint Weakness   HPI Jessica Nash is a 87 y.o. female with a history of blindness, CAD, diabetes, hypertension, hyperlipidemia who presents for altered mental status.  Patient is accompanied by her son-in-law.  Discharge from the hospital a week ago after presenting with altered mental status and found to be hypercapnic.  According to the son-in-law over the last few days patient has been very confused, seeing things that are not there.  He reports that she has had decreased urine output even with higher doses of Lasix.  He says that when she does urinate the smell is extremely strong.  She has been very weak.  Patient's only complaint is right ankle pain.  According to the son-in-law she almost had a fall couple days ago and twisted the ankle.  She has no history of gout.  She has had no fever, no cough, no vomiting or diarrhea, no difficulty breathing.  She denies headache, chest pain, shortness of breath, abdominal pain.   Past Medical History:  Diagnosis Date   Blind 2011   Breast cancer [facility])    Breast cancer metastasized to axillary lymph node ([place]) 4/12   Papillary carcinoma. 2/3 sentinel nodes positive. Dr Bary Castilla   Breast cyst    CAD (coronary artery disease)    Cellulitis and abscess of trunk 2013   Degenerative disc disease    Diabetes mellitus    Diverticulitis    GERD (gastroesophageal reflux disease)    Glaucoma 2010   blindness d/t glaucoma   Hx of colonic polyps    Hx of decompressive lumbar laminectomy    3 Level lumbar decompressive laminotomies with  foraminotomies 02/06   Hyperlipidemia    Hypertension    Myocardial infarction [facility]) 2010   Personal history of tobacco use, presenting hazards to health     Patient Active  Problem List   Diagnosis Date Noted   Acute on chronic respiratory failure ([place]) 04/20/2021   Blind    Myalgia 06/19/2012   Blindness of both eyes 06/08/2011   Breast cancer ([place])    CONSTIPATION, CHRONIC 06/10/2010   DECREASED HEARING 08/16/2009   Acute lower UTI 07/14/2008   CAD (coronary artery disease) 07/07/2008   OSTEOARTHRITIS 06/12/2008   Myocardial infarction ([place]) 2010   Hypercapnic respiratory failure ([place]) 2010   HYPERLIPIDEMIA 02/15/2007   TUBULOVILLOUS ADENOMA, 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   DIABETES MELLITUS, TYPE II 10/09/2006   GLAUCOMA 10/09/2006   Hypertension 10/09/2006   GERD 10/09/2006   DEGENERATIVE [place] DISEASE, LUMBAR SPINE 10/09/2006   COLONIC POLYPS, HX OF 10/09/2006    Past Surgical History:  Procedure Laterality Date   ABDOMINAL HYSTERECTOMY  1964   partial   ANGIOPLASTY     2/10 Angioplasty/stent-RCA---------Dr Arida   BACK SURGERY     BREAST BIOPSY Left 2012   BREAST SURGERY Right 2011   biopsy   COLON SURGERY  2006   COLONOSCOPY  2008,2011   Dr. Candace [facility]   CORONARY ANGIOPLASTY WITH STENT PLACEMENT  2010   done by Annia Belt MD   [place]  01/03-03/03   MASTECTOMY  4/12   Dr Kris Mouton RT after   MASTECTOMY Right 2012    Prior to Admission medications   Medication Sig Start Date End Date Taking? Authorizing Provider  amLODipine (  NORVASC) 5 MG tablet Take 5 mg by mouth daily.   Yes [provider]  anastrozole (ARIMIDEX) 1 MG tablet Take 1 mg by mouth daily.     Yes [provider]  aspirin 81 MG tablet Take 81 mg by mouth daily.     Yes [provider]  atorvastatin (LIPITOR) 40 MG tablet Take 40 mg by mouth at bedtime.   Yes [provider]  brimonidine-timolol (COMBIGAN) 0.2-0.5 % ophthalmic solution Place 1 drop into both eyes every 12 (twelve) hours.     Yes [provider]  Calcium Carbonate-Vitamin D (CALCIUM-VITAMIN D) 500-200 MG-UNIT per tablet Take 1 tablet by  mouth 2 (two) times daily with a meal.     Yes [provider]  cholecalciferol (VITAMIN D3) 25 MCG (1000 UNIT) tablet Take 1,000 Units by mouth daily.   Yes [provider]  famotidine (PEPCID) 40 MG tablet Take 40 mg by mouth daily.   Yes [provider]  gabapentin (NEURONTIN) 300 MG capsule Take 300 mg by mouth at bedtime.   Yes [provider]  glipiZIDE (GLUCOTROL) 5 MG tablet TAKE 1 TABLET BY MOUTH DAILY 07/11/12  Yes Viviana Simpler I, MD  isosorbide dinitrate (ISORDIL) 30 MG tablet Take 30 mg by mouth daily.   Yes [provider]  latanoprost (XALATAN) 0.005 % ophthalmic solution 1 drop at bedtime.   Yes [provider]  metoprolol (LOPRESSOR) 100 MG tablet TAKE 1 TABLET BY MOUTH TWICE A DAY 06/25/12  Yes Venia Carbon, MD  QUEtiapine (SEROQUEL) 25 MG tablet Take 1 tablet (25 mg total) by mouth at bedtime. 04/26/21 05/26/21 Yes Gherghe, Vella Redhead, MD  furosemide (LASIX) 20 MG tablet Take 1 tablet (20 mg total) by mouth daily as needed for fluid or edema. 04/26/21 04/26/22  Caren Griffins, MD  traMADol (ULTRAM) 50 MG tablet Take 25 mg by mouth every 6 (six) hours as needed.    [provider]    Allergies Lactose intolerance (gi)  Family History  Problem Relation Age of Onset   Cancer Daughter        colon cancer   Cancer Son        colon cancer   Cancer Other        unknoen family members with ovarian and breast cancers    Social History Social History   Tobacco Use   Smoking status: Former    Years: 20.00    Types: Cigarettes    Quit date: 05/30/2007    Years since quitting: 13.9   Smokeless tobacco: Never  Substance Use Topics   Alcohol use: No   Drug use: No    Review of Systems  Constitutional: Negative for fever. + confusion Eyes: Negative for visual changes. ENT: Negative for sore throat. Neck: No neck pain  Cardiovascular: Negative for chest pain. Respiratory: Negative for shortness of  breath. Gastrointestinal: Negative for abdominal pain, vomiting or diarrhea. Genitourinary: Negative for dysuria. Musculoskeletal: Negative for back pain. + R ankle pain Skin: Negative for rash. Neurological: Negative for headaches, weakness or numbness. Psych: No SI or HI  ____________________________________________   PHYSICAL EXAM:  VITAL SIGNS: ED Triage Vitals  Enc Vitals Group     BP 05/02/21 1924 (!) 153/79     Pulse Rate 05/02/21 1924 (!) 50     Resp 05/02/21 1924 20     Temp 05/02/21 1924 97.7 F (36.5 C)     Temp Source 05/02/21 1924 Oral  SpO2 05/02/21 1924 93 %     Weight --      Height --      Head Circumference --      [place] --      Pain Score 05/02/21 1925 8     Pain Loc --      Pain Edu? --      Excl. in [place]? --     Constitutional: Alert and oriented x2. no apparent distress.  Answering to questions appropriately, following commands HEENT:      Head: Normocephalic and atraumatic.         Eyes: Conjunctivae are normal. Sclera is non-icteric.       Mouth/Throat: Mucous membranes are moist.       Neck: Supple with no signs of meningismus. Cardiovascular: Regular rate and rhythm. No murmurs, gallops, or rubs. 2+ symmetrical distal pulses are present in all extremities. No JVD. Respiratory: Normal respiratory effort. Lungs are clear to auscultation bilaterally.  Gastrointestinal: Soft, non tender, and non distended with positive bowel sounds. No rebound or guarding. Genitourinary: No CVA tenderness. Musculoskeletal: There is swelling, mild erythema and warmth of the right ankle with intact range of motion.  The warmth and erythema extended to the right lower leg.  Skin is intact. Neurologic: Normal speech and language. Face is symmetric. Moving all extremities. No gross focal neurologic deficits are appreciated. Skin: Skin is warm, dry and intact. No rash noted. Psychiatric: Mood and affect are normal. Speech and behavior are  normal.  ____________________________________________   LABS (all labs ordered are listed, but only abnormal results are displayed)  Labs Reviewed  BASIC METABOLIC PANEL - Abnormal; Notable for the following components:      Result Value   Chloride 92 (*)    CO2 39 (*)    Glucose, Bld 66 (*)    Creatinine, Ser 1.25 (*)    GFR, Estimated 42 (*)    All other components within normal limits  CBC - Abnormal; Notable for the following components:   MCV 101.0 (*)    All other components within normal limits  URINALYSIS, ROUTINE W REFLEX MICROSCOPIC - Abnormal; Notable for the following components:   Color, Urine YELLOW (*)    APPearance CLEAR (*)    All other components within normal limits  BLOOD GAS, VENOUS - Abnormal; Notable for the following components:   pH, Ven 7.44 (*)    pCO2, Ven 70 (*)    pO2, Ven <31.0 (*)    Bicarbonate 47.5 (*)    Acid-Base Excess 19.1 (*)    All other components within normal limits  CBG MONITORING, ED - Abnormal; Notable for the following components:   Glucose-Capillary 54 (*)    All other components within normal limits  TROPONIN I (HIGH SENSITIVITY) - Abnormal; Notable for the following components:   Troponin I (High Sensitivity) 25 (*)    All other components within normal limits  TROPONIN I (HIGH SENSITIVITY) - Abnormal; Notable for the following components:   Troponin I (High Sensitivity) 24 (*)    All other components within normal limits  RESP PANEL BY RT-PCR (FLU A&B, COVID) [id number]  LACTIC ACID, PLASMA  CBG MONITORING, ED  CBG MONITORING, ED  CBG MONITORING, ED   ____________________________________________  EKG  ED ECG REPORT I, Rudene Re, the attending physician, personally viewed and interpreted this ECG.  Sinus rhythm with a rate of 66, right bundle branch block, no ST elevations or depressions.  Unchanged from prior. ____________________________________________  [id number]  I have personally reviewed the images  performed during this visit and I agree with the Radiologist's read.   Interpretation by Radiologist:  DG Ankle Complete Right  Result Date: 05/03/2021 CLINICAL DATA:  Recent fall 2 days ago with ankle pain, initial encounter EXAM: RIGHT ANKLE - COMPLETE 3+ VIEW COMPARISON:  None. FINDINGS: Oblique fracture through the distal fibula is noted with minimal displacement. Small bony density is noted adjacent to the tip of the medial malleolus which may represent mild avulsion. Widening of the tibiotalar joint is noted without significant dislocation. No other abnormality is noted. IMPRESSION: Bimalleolar fracture with some widening of the tibiotalar joint without true dislocation. Electronically Signed   By: Inez Catalina M.D.   On: 05/03/2021 02:30   CT Head Wo Contrast  Result Date: 05/03/2021 CLINICAL DATA:  Generalized weakness EXAM: CT HEAD WITHOUT CONTRAST TECHNIQUE: Contiguous axial images were obtained from the base of the skull through the vertex without intravenous contrast. COMPARISON:  04/20/2021 FINDINGS: Brain: There is no mass, hemorrhage or extra-axial collection. There is generalized atrophy without lobar predilection. Hypodensity of the white matter is most commonly associated with chronic microvascular disease. Vascular: No abnormal hyperdensity of the major intracranial arteries or dural venous sinuses. No intracranial atherosclerosis. Skull: The visualized skull base, calvarium and extracranial soft tissues are normal. Sinuses/Orbits: No fluid levels or advanced mucosal thickening of the visualized paranasal sinuses. No mastoid or middle ear effusion. Left globe calcification and volume loss. IMPRESSION: Generalized atrophy and chronic microvascular ischemia without acute intracranial abnormality. Electronically Signed   By: Ulyses Jarred M.D.   On: 05/03/2021 03:51     ____________________________________________   PROCEDURES  Procedure(s) performed:yes .1-3 Lead EKG  Interpretation Performed by: Rudene Re, MD Authorized by: Rudene Re, MD     Interpretation: abnormal     ECG rate assessment: normal     Rhythm: sinus rhythm     Ectopy: none     Conduction: abnormal     Critical Care performed:  None ____________________________________________   INITIAL IMPRESSION / ASSESSMENT AND PLAN / ED COURSE  87 y.o. female with a history of blindness, CAD, diabetes, hypertension, hyperlipidemia who presents for altered mental status.  Patient recently admitted and discharged a week ago for altered mental status in the setting of a UTI and hypercapnia.  Has been getting progressively worse at home, weaker and more confused, having hallucinations.  Here she is alert and oriented x2, answers to questions appropriately, follows commands.  Her only complaint is right ankle pain which does look swollen, red and hot to the touch.  She has decent range of motion of the joint therefore this is most likely cellulitis versus gout or a septic joint.  She has no prior history of gout.  Son-in-law says that she may have twisted this ankle therefore we will get an x-ray to rule out a fracture.  She was hypoglycemic initially with a sugar of 54.  They improved with the p.o. sugar and her glucose now 76.  Labs showing mildly elevated creatinine with no other significant electrolyte derangements.  No leukocytosis, no anemia.  UA is pending, COVID flu pending, VBG pending.  Old medical records review including patient's recent admission to the hospital.  History gathered from patient and her son-in-law's at bedside.  Plan discussed with both of them  _________________________ 5:20 AM on 05/03/2021 ----------------------------------------- Work-up essentially unremarkable with no source for patient's altered mental status.  COVID and flu negative.  UA with no signs of a  UTI.  Troponins are slightly elevated but unchanged.  She does have slightly elevated PCO2 but with a  normal pH and he seems like stable from her last admission therefore do not think hypercapnia is the reason for her symptoms.  Her sugars have stabilized.  Head CT was visualized by me unremarkable.  She does have a fracture of her ankle for which she was splinted per procedure note above.  At this time, after her evaluation in the emergency room and review of the note from her recent admission and the possible concern the patient might have dementia I do believe this is most likely dementia with sundowning/ behavioral disturbances.  I have tried to contact the family to see if they would like for patient to be placed in a geriatric facility since there is no indication for admission at this time.  I have left a message and I am waiting for them to call me back.  We will consult psychiatry for possible new diagnosis of dementia and PT.  We will hold off consulting social work at this time to see if family would like for patient to be placed.  Patient was very agitated with placement of the splint in her foot therefore 1 mg of IV Ativan was given.      _____________________________________________ Please note:  Patient was evaluated in Emergency Department today for the symptoms described in the history of present illness. Patient was evaluated in the context of the global COVID-19 pandemic, which necessitated consideration that the patient might be at risk for infection with the SARS-CoV-2 virus that causes COVID-19. Institutional protocols and algorithms that pertain to the evaluation of patients at risk for COVID-19 are in a state of rapid change based on information released by regulatory bodies including the CDC and federal and state organizations. These policies and algorithms were followed during the patient's care in the ED.  Some ED evaluations and interventions may be delayed as a result of limited staffing during the pandemic.   [place] Controlled Substance Database was reviewed by  me. ____________________________________________   FINAL CLINICAL IMPRESSION(S) / ED DIAGNOSES   Final diagnoses:  Confusion  Closed bimalleolar fracture of right ankle, initial encounter      NEW MEDICATIONS STARTED DURING THIS VISIT:  ED Discharge Orders     None        Note:  This document was prepared using Dragon voice recognition software and may include unintentional dictation errors.    Rudene Re, MD 05/03/21 [phone]

## 2021-05-03 NOTE — Evaluation (Signed)
Physical Therapy Evaluation Patient Details Name: Jessica Nash MRN: [id number] DOB: 04-Dec-1932 Today's Date: 05/03/2021  History of Present Illness  Patient is an 87 year old female who presents to ED on 05/02/21 for increased generalized weakness,AMS and fatigue. Per son in law she had an almost fall a few days ago and twisted her ankle. Pt admitted to [facility] on 04/20/21 and discharged 04/26/21 for AMS, abdominal pain, and acute on chronic respiratory failure. Pt recently moved to [place] from [place]. Per daughter, pt was hospitalized in [place] 4 weeks ago for hypoxia and was placed on oxygen. Significant PMH includes: chronic respiratory failure (on 2L at baseline), CAD on dual antiplatelet therapy, legally blind, MI, HTN, HLD, DM, and CAD. X ray found to have bimalleloar fracture with some widenin gof the tibiofibular joint without true dilocation, per attending is partial weightbearing.   Clinical Impression  Patient is an 87 year old female who presents to hospital with confusion, bimalleloar fracture, and generalized weakness. Patient was recently admitted on 04/20/21 and discharged six days later. She is unable to assist in history due to severe confusion at this time and history obtained from recent admission. Patient is agitated and confused throughout session and is able to roll with mod assistance. She is unable to fully sit EOB as her agitation elevated. She is PWB through her RLE however at her current cognition status she is unable to demonstrate understanding. At this time patient is unsafe to return home and will require SNF placement due to high fall risk and risk for re-admission. Patient will require PT while hospitalized to increase strength, mobility, and decrease fall risk.        Recommendations for follow up therapy are one component of a multi-disciplinary discharge planning process, led by the attending physician.  Recommendations may be updated based on patient status, additional functional  criteria and insurance authorization.  Follow Up Recommendations Skilled nursing-short term rehab (<3 hours/day)    Assistance Recommended at Discharge Frequent or constant Supervision/Assistance  Functional Status Assessment Patient has had a recent decline in their functional status and/or demonstrates limited ability to make significant improvements in function in a reasonable and predictable amount of time  Equipment Recommendations       Recommendations for Other Services OT consult     Precautions / Restrictions Precautions Precautions: Fall;Other (comment) Precaution Comments: legally blind,  bimalleloar fracture RLE Required Braces or Orthoses: Splint/Cast Splint/Cast: R ankle Restrictions Weight Bearing Restrictions: Yes RLE Weight Bearing: Partial weight bearing RLE Partial Weight Bearing Percentage or Pounds: per physician can touch ground with splint      Mobility  Bed Mobility Overal bed mobility: Needs Assistance Bed Mobility: Rolling;Supine to Sit Rolling: Mod assist   Supine to sit: Max assist     General bed mobility comments: unable to obtain full sitting position due to patient agitation and confusion.    Transfers                   General transfer comment: unable to attempt due to patient agitation and confusion    Ambulation/Gait               General Gait Details: unsafe to attempt due to patient agitation and confusion.  Stairs            Wheelchair Mobility    Modified Rankin (Stroke Patients Only)       Balance Overall balance assessment: Needs assistance     Sitting balance - Comments: unable to  assess due to inability to obtain seated position.       Standing balance comment: unable to assess due to inability to obtain seated position.                             Pertinent Vitals/Pain Pain Assessment: Faces Faces Pain Scale: Hurts even more Pain Location: RLE Pain Descriptors / Indicators:  Aching;Discomfort Pain Intervention(s): Limited activity within patient's tolerance;Monitored during session;Repositioned    Home Living Family/patient expects to be discharged to:: Private residence Living Arrangements: Children Available Help at Discharge: Family;Available 24 hours/day Type of Home: House Home Access: Stairs to enter Entrance Stairs-Rails: None Entrance Stairs-Number of Steps: 1 Alternate Level Stairs-Number of Steps: flight Home Layout: Two level;Bed/bath upstairs   Additional Comments: Patient unable to provide history, home set up taken from previous admission    Prior Function               Mobility Comments: Pt recently moved from [place] to live with her daughter in [place]. Pt is legally blind & daughter would provide supervision/HHA for gait in the home, otherwise pt did not use AD & has had no recent falls. ADLs Comments: Patient unable to provide history, home set up taken from previous admission     Hand Dominance        Extremity/Trunk Assessment   Upper Extremity Assessment Upper Extremity Assessment: Defer to OT evaluation    Lower Extremity Assessment Lower Extremity Assessment: Generalized weakness;Difficult to assess due to impaired cognition (unable to obtain seated position due to agitation and confusion.)       Communication      Cognition Arousal/Alertness: Lethargic Behavior During Therapy: Agitated;Anxious Overall Cognitive Status: No family/caregiver present to determine baseline cognitive functioning Area of Impairment: Orientation;Attention;Memory;Following commands;Awareness;Safety/judgement;Problem solving                 Orientation Level: Disoriented to;Time;Situation;Place     Following Commands: Follows one step commands inconsistently       General Comments: Patient limited awareness of place, situation, and self.        General Comments General comments (skin integrity, edema, etc.): Patient session  severely limited by agitation and confusion.    Exercises Other Exercises Other Exercises: Patient educated on role of PT in acute care setting, safe bed mobility, PWB to R foot.   Assessment/Plan    PT Assessment Patient needs continued PT services  PT Problem List Decreased strength;Decreased mobility;Decreased safety awareness;Decreased balance;Decreased knowledge of use of DME;Decreased activity tolerance;Cardiopulmonary status limiting activity;Decreased knowledge of precautions;Pain       PT Treatment Interventions DME instruction;Therapeutic activities;Cognitive remediation;Modalities;Gait training;Therapeutic exercise;Patient/family education;Stair training;Balance training;Functional mobility training;Neuromuscular re-education;Manual techniques    PT Goals (Current goals can be found in the Care Plan section)  Acute Rehab PT Goals PT Goal Formulation: Patient unable to participate in goal setting    Frequency 7X/week   Barriers to discharge Inaccessible home environment;Decreased caregiver support patient unsafe to return home    Co-evaluation               AM-PAC PT "6 Clicks" Mobility  Outcome Measure Help needed turning from your back to your side while in a flat bed without using bedrails?: A Lot Help needed moving from lying on your back to sitting on the side of a flat bed without using bedrails?: Total Help needed moving to and from a bed to a chair (including a wheelchair)?: Total  Help needed standing up from a chair using your arms (e.g., wheelchair or bedside chair)?: Total Help needed to walk in hospital room?: Total Help needed climbing 3-5 steps with a railing? : Total 6 Click Score: 7    End of Session Equipment Utilized During Treatment: Oxygen (3 L of 02) Activity Tolerance: Treatment limited secondary to agitation Patient left: in bed;with call bell/phone within reach;with bed alarm set Nurse Communication: Mobility status;Weight bearing  status PT Visit Diagnosis: Unsteadiness on feet (R26.81);Other abnormalities of gait and mobility (R26.89);Muscle weakness (generalized) (M62.81);Difficulty in walking, not elsewhere classified (R26.2);Pain Pain - Right/Left: Right Pain - part of body: Leg    Time: 3343-5686 PT Time Calculation (min) (ACUTE ONLY): 14 min   Charges:   PT Evaluation $PT Eval Moderate Complexity: 1 Mod         Janna Arch, PT, DPT  05/03/2021, 8:24 AM

## 2021-05-03 NOTE — Consult Note (Signed)
EDP requested consult for agitation. Patient has been sleeping each time approached today. Chart reviewed. Zyprexa 2.5 mg BID as needed added.

## 2021-05-03 NOTE — ED Notes (Signed)
Pt in bed, cleaned pt and changed brief, replaced pure wick.  Meal tray ordered.

## 2021-05-03 NOTE — ED Notes (Signed)
Pt incont large amount urine; pt taken to triage; transferred onto stretcher and clensed; linen, gown & attends changed; warm blankets given

## 2021-05-03 NOTE — ED Provider Notes (Signed)
Patient was signed out to me at 7 AM.  In brief she is an 87 year old female who is presenting with encephalopathy.  She is found to have a bilateral malleoli fracture but her work-up is otherwise reassuring.  We have attempted to call her daughter to see if they would like for the patient to be discharged home versus placed.  In the meantime she is awaiting a PT and [facility] psych consult.   Patient evaluated by PT who recommends SNF placement.  Psych consult is pending.  I attempted to call the patient's daughter Andris Flurry but the call went straight to voicemail.  We will continue to attempt but will start the patient's home meds in the meantime.  Pt seen by psych, prn zyprexa addded. Spoke with pts daughter who is coming to see her and will decide from there whether she would like her to be placed.    Rada Hay, MD 05/03/21 [phone]

## 2021-05-04 ENCOUNTER — Encounter: Payer: Self-pay | Admitting: Internal Medicine

## 2021-05-04 DIAGNOSIS — J9612 Chronic respiratory failure with hypercapnia: Secondary | ICD-10-CM | POA: Diagnosis present

## 2021-05-04 DIAGNOSIS — E1122 Type 2 diabetes mellitus with diabetic chronic kidney disease: Secondary | ICD-10-CM | POA: Diagnosis present

## 2021-05-04 DIAGNOSIS — Z79811 Long term (current) use of aromatase inhibitors: Secondary | ICD-10-CM | POA: Diagnosis not present

## 2021-05-04 DIAGNOSIS — W19XXXA Unspecified fall, initial encounter: Secondary | ICD-10-CM | POA: Diagnosis present

## 2021-05-04 DIAGNOSIS — H543 Unqualified visual loss, both eyes: Secondary | ICD-10-CM | POA: Diagnosis present

## 2021-05-04 DIAGNOSIS — E785 Hyperlipidemia, unspecified: Secondary | ICD-10-CM | POA: Diagnosis present

## 2021-05-04 DIAGNOSIS — Z800 Family history of malignant neoplasm of digestive organs: Secondary | ICD-10-CM | POA: Diagnosis not present

## 2021-05-04 DIAGNOSIS — Z90711 Acquired absence of uterus with remaining cervical stump: Secondary | ICD-10-CM | POA: Diagnosis not present

## 2021-05-04 DIAGNOSIS — I130 Hypertensive heart and chronic kidney disease with heart failure and stage 1 through stage 4 chronic kidney disease, or unspecified chronic kidney disease: Secondary | ICD-10-CM | POA: Diagnosis present

## 2021-05-04 DIAGNOSIS — Z79899 Other long term (current) drug therapy: Secondary | ICD-10-CM | POA: Diagnosis not present

## 2021-05-04 DIAGNOSIS — Z20822 Contact with and (suspected) exposure to covid-19: Secondary | ICD-10-CM | POA: Diagnosis present

## 2021-05-04 DIAGNOSIS — N1831 Chronic kidney disease, stage 3a: Secondary | ICD-10-CM | POA: Diagnosis present

## 2021-05-04 DIAGNOSIS — Z955 Presence of coronary angioplasty implant and graft: Secondary | ICD-10-CM | POA: Diagnosis not present

## 2021-05-04 DIAGNOSIS — R410 Disorientation, unspecified: Secondary | ICD-10-CM | POA: Diagnosis present

## 2021-05-04 DIAGNOSIS — I2510 Atherosclerotic heart disease of native coronary artery without angina pectoris: Secondary | ICD-10-CM | POA: Diagnosis present

## 2021-05-04 DIAGNOSIS — I248 Other forms of acute ischemic heart disease: Secondary | ICD-10-CM | POA: Diagnosis present

## 2021-05-04 DIAGNOSIS — Z7982 Long term (current) use of aspirin: Secondary | ICD-10-CM | POA: Diagnosis not present

## 2021-05-04 DIAGNOSIS — Z66 Do not resuscitate: Secondary | ICD-10-CM | POA: Diagnosis present

## 2021-05-04 DIAGNOSIS — Z86010 Personal history of colonic polyps: Secondary | ICD-10-CM | POA: Diagnosis not present

## 2021-05-04 DIAGNOSIS — Z7984 Long term (current) use of oral hypoglycemic drugs: Secondary | ICD-10-CM | POA: Diagnosis not present

## 2021-05-04 DIAGNOSIS — Z87891 Personal history of nicotine dependence: Secondary | ICD-10-CM | POA: Diagnosis not present

## 2021-05-04 DIAGNOSIS — Z853 Personal history of malignant neoplasm of breast: Secondary | ICD-10-CM | POA: Diagnosis not present

## 2021-05-04 DIAGNOSIS — I252 Old myocardial infarction: Secondary | ICD-10-CM | POA: Diagnosis not present

## 2021-05-04 DIAGNOSIS — Z9011 Acquired absence of right breast and nipple: Secondary | ICD-10-CM | POA: Diagnosis not present

## 2021-05-04 DIAGNOSIS — G9341 Metabolic encephalopathy: Secondary | ICD-10-CM | POA: Diagnosis present

## 2021-05-04 DIAGNOSIS — E739 Lactose intolerance, unspecified: Secondary | ICD-10-CM | POA: Diagnosis present

## 2021-05-04 DIAGNOSIS — N189 Chronic kidney disease, unspecified: Secondary | ICD-10-CM | POA: Diagnosis present

## 2021-05-04 LAB — GLUCOSE, CAPILLARY
Glucose-Capillary: 105 mg/dL — ABNORMAL HIGH (ref 70–99)
Glucose-Capillary: 116 mg/dL — ABNORMAL HIGH (ref 70–99)
Glucose-Capillary: 121 mg/dL — ABNORMAL HIGH (ref 70–99)
Glucose-Capillary: 123 mg/dL — ABNORMAL HIGH (ref 70–99)
Glucose-Capillary: 73 mg/dL (ref 70–99)
Glucose-Capillary: 83 mg/dL (ref 70–99)
Glucose-Capillary: 84 mg/dL (ref 70–99)

## 2021-05-04 LAB — LIPID PANEL
Cholesterol: 108 mg/dL (ref 0–200)
HDL: 30 mg/dL — ABNORMAL LOW (ref >40)
LDL Cholesterol: 47 mg/dL (ref 0–99)
Total CHOL/HDL Ratio: 3.6 RATIO
Triglycerides: 153 mg/dL — ABNORMAL HIGH (ref <150)
VLDL: 31 mg/dL (ref 0–40)

## 2021-05-04 LAB — HEMOGLOBIN A1C
Hgb A1c MFr Bld: 7.3 % — ABNORMAL HIGH (ref 4.8–5.6)
Mean Plasma Glucose: 163 mg/dL

## 2021-05-04 MED ORDER — PROSOURCE PLUS PO LIQD
30.0000 mL | Freq: Two times a day (BID) | ORAL | Status: DC
  Administered 2021-05-04 – 2021-05-05 (×2): 30 mL via ORAL
  Filled 2021-05-04 (×3): qty 30

## 2021-05-04 MED ORDER — ADULT MULTIVITAMIN W/MINERALS CH
1.0000 | ORAL_TABLET | Freq: Every day | ORAL | Status: DC
  Administered 2021-05-04 – 2021-05-05 (×2): 1 via ORAL
  Filled 2021-05-04 (×2): qty 1

## 2021-05-04 MED ORDER — ORAL CARE MOUTH RINSE
15.0000 mL | Freq: Two times a day (BID) | OROMUCOSAL | Status: DC
  Administered 2021-05-04 – 2021-05-05 (×3): 15 mL via OROMUCOSAL

## 2021-05-04 NOTE — TOC Progression Note (Addendum)
Transition of Care [facility]) - Progression Note    Patient Details  Name: Jessica Nash MRN: [id number] Date of Birth: 05/10/1933  Transition of Care [facility]) CM/SW [place], RN Phone Number: 05/04/2021, 9:29 AM  Clinical Narrative:  Attempted to call Daughter Andris Flurry to discuss bed offers from [facility] and [facility] no answer LVM to return call.  1:30pm. Daughter returned call, discussed the bed offers and she began to cry, says she can not drive to visit if she was outside [place] due to her own disability. I advised her that I will check with local facilities that did not respond to see if they have a bed, however can't promise one. Daughter receptive. Reached out to [place], [place] and [facility]. [facility] called back with concerns of patient not participating with PT, which is a concern. I told her I would speak with patient and call back.  2:45pm. LATE ENTRY Spoke with patient who agreed and promised she will participate with PT as planned. Patient understands that PT is required for admission to the facility. Also called daughter to inform her of PT participation and requirements, daughter indicates she will encourage her to do so. Called [name] from [facility] and informed her of my conversation with patient and her promise to participate in PT. [facility] admission approved.    Expected Discharge Plan: [place] Barriers to Discharge: Continued Medical Work up  Expected Discharge Plan and Services Expected Discharge Plan: [place]   Discharge Planning Services: CM Consult Post Acute Care Choice: [place] Living arrangements for the past 2 months: Single Family Home                 DME Arranged: N/A DME Agency: NA       HH Arranged: NA HH Agency: NA         Social Determinants of Health (SDOH) Interventions    Readmission Risk Interventions No flowsheet data found.

## 2021-05-04 NOTE — Progress Notes (Signed)
PROGRESS NOTE    Jessica Nash  CWC:[id number] DOB: 24-Sep-1932 DOA: 05/03/2021 PCP: System, Provider Not In    Brief Narrative:  Jessica Nash is a 87 y.o. female with medical history significant of hypertension, hyperlipidemia, diabetes mellitus, GERD, depression, former smoker, CAD, diverticulitis, breast cancer, bilateral blindness, CKD-3A, who presents with altered mental status,s/p fall, bilateral ankle pain.  Covid negative  Consultants:  orthopedics  Procedures:   Antimicrobials:      Subjective: Pt is sleepy, only moans, doesn't answer me.   Objective: Vitals:   05/03/21 2003 05/03/21 2057 05/04/21 0035 05/04/21 0545  BP: 101/83 134/72 118/69 132/75  Pulse: 84 85 60 69  Resp: 18 19 17 16   Temp: 98.2 F (36.8 C) 98.2 F (36.8 C) (!) 97.4 F (36.3 C) 98 F (36.7 C)  TempSrc: Oral Oral Oral Oral  SpO2: 98% 96% 100% 99%   No intake or output data in the 24 hours ending 05/04/21 0823 There were no vitals filed for this visit.  Examination:  General exam: Appears calm and comfortable  Respiratory system: Clear to auscultation. Respiratory effort normal. Cardiovascular system: S1 & S2 heard, RRR. No JVD, murmurs, rubs, gallops or clicks.  Gastrointestinal system: Abdomen is nondistended, soft and nontender. . Normal bowel sounds heard. Central nervous system: unable to assess. Extremities: no edema      Data Reviewed: I have personally reviewed following labs and imaging studies  CBC: Recent Labs  Lab 05/02/21 1928  WBC 6.3  HGB 12.9  HCT 42.5  MCV 101.0*  PLT 761   Basic Metabolic Panel: Recent Labs  Lab 05/02/21 1928  NA 138  K 4.5  CL 92*  CO2 39*  GLUCOSE 66*  BUN 22  CREATININE 1.25*  CALCIUM 10.0   GFR: Estimated Creatinine Clearance: 33.2 mL/min (A) (by C-G formula based on SCr of 1.25 mg/dL (H)). Liver Function Tests: No results for input(s): AST, ALT, ALKPHOS, BILITOT, PROT, ALBUMIN in the last 168 hours. No results for input(s):  LIPASE, AMYLASE in the last 168 hours. No results for input(s): AMMONIA in the last 168 hours. Coagulation Profile: No results for input(s): INR, PROTIME in the last 168 hours. Cardiac Enzymes: No results for input(s): CKTOTAL, CKMB, CKMBINDEX, TROPONINI in the last 168 hours. BNP (last 3 results) No results for input(s): PROBNP in the last 8760 hours. HbA1C: No results for input(s): HGBA1C in the last 72 hours. CBG: Recent Labs  Lab 05/03/21 1334 05/03/21 1754 05/03/21 2011 05/04/21 0031 05/04/21 0445  GLUCAP 101* 76 131* 73 83   Lipid Profile: Recent Labs    05/04/21 0351  CHOL 108  HDL 30*  LDLCALC 47  TRIG 153*  CHOLHDL 3.6   Thyroid Function Tests: No results for input(s): TSH, T4TOTAL, FREET4, T3FREE, THYROIDAB in the last 72 hours. Anemia Panel: No results for input(s): VITAMINB12, FOLATE, FERRITIN, TIBC, IRON, RETICCTPCT in the last 72 hours. Sepsis Labs: Recent Labs  Lab 05/03/21 0238  LATICACIDVEN 1.4    Recent Results (from the past 240 hour(s))  Resp Panel by RT-PCR (Flu A&B, Covid) Nasopharyngeal Swab     Status: None   Collection Time: 05/03/21  3:50 AM   Specimen: Nasopharyngeal Swab; Nasopharyngeal(NP) swabs in vial transport medium  Result Value Ref Range Status   SARS Coronavirus 2 by RT PCR NEGATIVE NEGATIVE Final    Comment: (NOTE) SARS-CoV-2 target nucleic acids are NOT DETECTED.  The SARS-CoV-2 RNA is generally detectable in upper respiratory specimens during the acute phase of infection. The  lowest concentration of SARS-CoV-2 viral copies this assay can detect is 138 copies/mL. A negative result does not preclude SARS-Cov-2 infection and should not be used as the sole basis for treatment or other patient management decisions. A negative result may occur with  improper specimen collection/handling, submission of specimen other than nasopharyngeal swab, presence of viral mutation(s) within the areas targeted by this assay, and inadequate  number of viral copies(<138 copies/mL). A negative result must be combined with clinical observations, patient history, and epidemiological information. The expected result is Negative.  Fact Sheet for Patients:  EntrepreneurPulse.com.au  Fact Sheet for Healthcare Providers:  IncredibleEmployment.be  This test is no t yet approved or cleared by the [place] FDA and  has been authorized for detection and/or diagnosis of SARS-CoV-2 by FDA under an Emergency Use Authorization (EUA). This EUA will remain  in effect (meaning this test can be used) for the duration of the COVID-19 declaration under Section 564(b)(1) of the Act, 21 U.S.C.section 360bbb-3(b)(1), unless the authorization is terminated  or revoked sooner.       Influenza A by PCR NEGATIVE NEGATIVE Final   Influenza B by PCR NEGATIVE NEGATIVE Final    Comment: (NOTE) The Xpert Xpress SARS-CoV-2/FLU/RSV plus assay is intended as an aid in the diagnosis of influenza from Nasopharyngeal swab specimens and should not be used as a sole basis for treatment. Nasal washings and aspirates are unacceptable for Xpert Xpress SARS-CoV-2/FLU/RSV testing.  Fact Sheet for Patients: EntrepreneurPulse.com.au  Fact Sheet for Healthcare Providers: IncredibleEmployment.be  This test is not yet approved or cleared by the [place] FDA and has been authorized for detection and/or diagnosis of SARS-CoV-2 by FDA under an Emergency Use Authorization (EUA). This EUA will remain in effect (meaning this test can be used) for the duration of the COVID-19 declaration under Section 564(b)(1) of the Act, 21 U.S.C. section 360bbb-3(b)(1), unless the authorization is terminated or revoked.  Performed at [facility], [address], [place], [place] [address]          Radiology Studies: DG Ankle Complete Right  Result Date: 05/03/2021 CLINICAL DATA:   Recent fall 2 days ago with ankle pain, initial encounter EXAM: RIGHT ANKLE - COMPLETE 3+ VIEW COMPARISON:  None. FINDINGS: Oblique fracture through the distal fibula is noted with minimal displacement. Small bony density is noted adjacent to the tip of the medial malleolus which may represent mild avulsion. Widening of the tibiotalar joint is noted without significant dislocation. No other abnormality is noted. IMPRESSION: Bimalleolar fracture with some widening of the tibiotalar joint without true dislocation. Electronically Signed   By: Inez Catalina M.D.   On: 05/03/2021 02:30   CT Head Wo Contrast  Result Date: 05/03/2021 CLINICAL DATA:  Generalized weakness EXAM: CT HEAD WITHOUT CONTRAST TECHNIQUE: Contiguous axial images were obtained from the base of the skull through the vertex without intravenous contrast. COMPARISON:  04/20/2021 FINDINGS: Brain: There is no mass, hemorrhage or extra-axial collection. There is generalized atrophy without lobar predilection. Hypodensity of the white matter is most commonly associated with chronic microvascular disease. Vascular: No abnormal hyperdensity of the major intracranial arteries or dural venous sinuses. No intracranial atherosclerosis. Skull: The visualized skull base, calvarium and extracranial soft tissues are normal. Sinuses/Orbits: No fluid levels or advanced mucosal thickening of the visualized paranasal sinuses. No mastoid or middle ear effusion. Left globe calcification and volume loss. IMPRESSION: Generalized atrophy and chronic microvascular ischemia without acute intracranial abnormality. Electronically Signed   By: Ulyses Jarred  M.D.   On: 05/03/2021 03:51        Scheduled Meds:  amLODipine  5 mg Oral Daily   anastrozole  1 mg Oral Daily   aspirin EC  81 mg Oral Daily   atorvastatin  40 mg Oral QHS   brimonidine  1 drop Both Eyes BID   And   timolol  1 drop Both Eyes BID   calcium-vitamin D  1 tablet Oral BID WC   cholecalciferol  1,000  Units Oral Daily   enoxaparin (LOVENOX) injection  40 mg Subcutaneous Q24H   famotidine  20 mg Oral Daily   gabapentin  300 mg Oral QHS   insulin aspart  0-9 Units Subcutaneous Q4H   ipratropium-albuterol  3 mL Nebulization BID   isosorbide dinitrate  30 mg Oral Daily   latanoprost  1 drop Both Eyes QHS   mouth rinse  15 mL Mouth Rinse BID   metoprolol tartrate  100 mg Oral BID   QUEtiapine  25 mg Oral QHS   Continuous Infusions:  Assessment & Plan:   Principal Problem:   Acute metabolic encephalopathy Active Problems:   Type II diabetes mellitus with renal manifestations (HCC)   HLD (hyperlipidemia)   Hypertension   CAD (coronary artery disease)   Breast cancer (HCC)   Blind   Hypercapnic respiratory failure (HCC)   Elevated troponin   CKD (chronic kidney disease), stage IIIa   Fall   Ankle fracture, bimalleolar, closed   Acute metabolic encephalopathy: Etiology is not clear.  CT head is negative.  No focal deficit on physical examination.  Differential diagnosis include delirium, severe pain due to ankle fracture, early stage of dementia.  Per her daughter, patient had history of hypercapnic respiratory failure in the past.   VBG not much disturbance  Continue to monitor  Type II diabetes mellitus with renal manifestations [facility]): Recent A1c 7.0.   Continue RISS    HLD (hyperlipidemia) On statin   Hypertension Stable , continue current management   CAD (coronary artery disease) and elevated troponin:  2/2 demand ischemia Asa, statin, imdur    Hx of Breast cancer (HCC) -Continue anastrozole   Blind -Fall precaution   Hypercapnic respiratory failure (HCC) -Bronchodilators   CKD (chronic kidney disease), stage IIIa: Stable. -f/u by BMP   Fall and ankle fracture, bimalleolar, closed: Patient has a bilateral ankle fracture.  Daughter does not want patient to do any surgery.   Ortho input was appreciated.  Rec. Non weight bearing and conservative tx Keep  Keep splint clean and dry and will change to a cast in 5 to 7 days once the swelling has improved. Continue with elevation and pain control PT/OT  Rehab placement     DVT prophylaxis: lovenox Code Status:DNR Family Communication: none at bedside Disposition Plan: Rehab pending Status is: Observation  The patient remains OBS appropriate and will d/c before 2 midnights.           LOS: 0 days   Time spent: 35 minutes with more than 50% on [place], MD [facility] Pager [phone]  If 7PM-7AM, please contact night-coverage 05/04/2021, 8:23 AM

## 2021-05-04 NOTE — Consult Note (Signed)
ORTHOPAEDIC CONSULTATION  REQUESTING PHYSICIAN: Nolberto Hanlon, MD  Chief Complaint: ankle pain  HPI: Jessica Nash is a 87 y.o. female who complains of ankle pain. She has been confused over this admission and history is obtained primarily from her chart. The pain is sharp in character. The pain is severe and 7/10. The pain is worse with movement and better with rest. She is able to follow commands.   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 Dr Bary Castilla   Breast cyst    CAD (coronary artery disease)    Cellulitis and abscess of trunk 2013   Degenerative disc disease    Diabetes mellitus    Diverticulitis    GERD (gastroesophageal reflux disease)    Glaucoma 2010   blindness d/t glaucoma   Hx of colonic polyps    Hx of decompressive lumbar laminectomy    3 Level lumbar decompressive laminotomies with  foraminotomies 02/06   Hyperlipidemia    Hypertension    Myocardial infarction [facility]) 2010   Personal history of tobacco use, presenting hazards to health    Past Surgical History:  Procedure Laterality Date   ABDOMINAL HYSTERECTOMY  1964   partial   ANGIOPLASTY     2/10 Angioplasty/stent-RCA---------Dr Arida   BACK SURGERY     BREAST BIOPSY Left 2012   BREAST SURGERY Right 2011   biopsy   COLON SURGERY  2006   COLONOSCOPY  2008,2011   Dr. Candace Cruise [facility]   CORONARY ANGIOPLASTY WITH STENT PLACEMENT  2010   done by Annia Belt MD   [place]  01/03-03/03   MASTECTOMY  4/12   Dr Kris Mouton RT after   MASTECTOMY Right 2012   Social History   Socioeconomic History   Marital status: Widowed    Spouse name: Not on file   Number of children: 5   Years of education: Not on file   Highest education level: Not on file  Occupational History   Occupation: retired as Sport and exercise psychologist    Employer:  RETIRED  Tobacco Use   Smoking status: Former    Years: 20.00    Types: Cigarettes    Quit date: 05/30/2007    Years since quitting: 13.9   Smokeless tobacco: Never  Substance and Sexual Activity   Alcohol use: No   Drug use: No   Sexual activity: Not on file  Other Topics Concern   Not on file  Social History Narrative   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 Authorizing Provider  amLODipine (NORVASC) 5 MG tablet Take 5 mg by mouth daily.  Yes [provider]  anastrozole (ARIMIDEX) 1 MG tablet Take 1 mg by mouth daily.     Yes [provider]  aspirin 81 MG tablet Take 81 mg by mouth daily.     Yes [provider]  atorvastatin (LIPITOR) 40 MG tablet Take 40 mg by mouth at bedtime.   Yes [provider]  brimonidine-timolol (COMBIGAN) 0.2-0.5 % ophthalmic solution Place 1 drop into both eyes every 12 (twelve) hours.     Yes [provider]  Calcium Carbonate-Vitamin D (CALCIUM-VITAMIN D) 500-200 MG-UNIT per tablet Take 1 tablet by mouth 2 (two) times daily with a meal.     Yes [provider]  cholecalciferol (VITAMIN D3) 25 MCG (1000 UNIT) tablet Take 1,000 Units by mouth daily.   Yes [provider]  famotidine (PEPCID) 40 MG tablet Take 40 mg by mouth daily.   Yes [provider]  gabapentin (NEURONTIN) 300 MG capsule Take 300 mg by mouth at  bedtime.   Yes [provider]  glipiZIDE (GLUCOTROL) 5 MG tablet TAKE 1 TABLET BY MOUTH DAILY 07/11/12  Yes Viviana Simpler I, MD  isosorbide dinitrate (ISORDIL) 30 MG tablet Take 30 mg by mouth daily.   Yes [provider]  latanoprost (XALATAN) 0.005 % ophthalmic solution 1 drop at bedtime.   Yes [provider]  metoprolol (LOPRESSOR) 100 MG tablet TAKE 1 TABLET BY MOUTH TWICE A DAY 06/25/12  Yes Venia Carbon, MD  QUEtiapine (SEROQUEL) 25 MG tablet Take 1 tablet (25 mg total) by mouth at bedtime. 04/26/21 05/26/21 Yes Gherghe, Vella Redhead, MD  furosemide (LASIX) 20 MG tablet Take 1 tablet (20 mg total) by mouth daily as needed for fluid or edema. 04/26/21 04/26/22  Caren Griffins, MD  traMADol (ULTRAM) 50 MG tablet Take 25 mg by mouth every 6 (six) hours as needed.    [provider]   DG Ankle Complete Right  Result Date: 05/03/2021 CLINICAL DATA:  Recent fall 2 days ago with ankle pain, initial encounter EXAM: RIGHT ANKLE - COMPLETE 3+ VIEW COMPARISON:  None. FINDINGS: Oblique fracture through the distal fibula is noted with minimal displacement. Small bony density is noted adjacent to the tip of the medial malleolus which may represent mild avulsion. Widening of the tibiotalar joint is noted without significant dislocation. No other abnormality is noted. IMPRESSION: Bimalleolar fracture with some widening of the tibiotalar joint without true dislocation. Electronically Signed   By: Inez Catalina M.D.   On: 05/03/2021 02:30   CT Head Wo Contrast  Result Date: 05/03/2021 CLINICAL DATA:  Generalized weakness EXAM: CT HEAD WITHOUT CONTRAST TECHNIQUE: Contiguous axial images were obtained from the base of the skull through the vertex without intravenous contrast. COMPARISON:  04/20/2021 FINDINGS: Brain: There is no mass, hemorrhage or extra-axial collection. There is generalized atrophy without lobar predilection. Hypodensity of the white matter is most commonly  associated with chronic microvascular disease. Vascular: No abnormal hyperdensity of the major intracranial arteries or dural venous sinuses. No intracranial atherosclerosis. Skull: The visualized skull base, calvarium and extracranial soft tissues are normal. Sinuses/Orbits: No fluid levels or advanced mucosal thickening of the visualized paranasal sinuses. No mastoid or middle ear effusion. Left globe calcification and volume loss. IMPRESSION: Generalized atrophy and chronic microvascular ischemia without acute intracranial abnormality. Electronically Signed   By: Ulyses Jarred M.D.   On: 05/03/2021 03:51    Positive ROS: All other systems have been reviewed and were otherwise negative with the exception of  those mentioned in the HPI and as above.  Physical Exam: General: Alert, no acute distress Cardiovascular: No pedal edema Respiratory: No cyanosis, no use of accessory musculature GI: No organomegaly, abdomen is soft and non-tender Skin: No lesions in the area of chief complaint Neurologic: Sensation intact distally Psychiatric: Patient is competent for consent with normal mood and affect Lymphatic: No axillary or cervical lymphadenopathy  MUSCULOSKELETAL: RLE: Compartments soft. Good cap refill. Motor and sensory intact distally.  Assessment: Right closed bimalleolar ankle fracture  Plan: Recommend non-weight bearing and conservative treatment. Keep splint clean and dry and will change to a cast in 5 to 7 days once the swelling has improved. Continue with elevation and pain control. Please call with questions.    Lovell Sheehan, MD    05/04/2021 9:09 AM

## 2021-05-04 NOTE — Progress Notes (Signed)
  Chaplain On-Call responded to Spiritual Care Consult Order: "patient requests prayer".  Chaplain attempted to visit the patient.  Patient was sleeping soundly at 1215.  Chaplain will refer to the Afternoon Chaplain for follow up.  Chaplain Pollyann Samples M.Div., [facility]

## 2021-05-04 NOTE — Progress Notes (Signed)
Physical Therapy Treatment Patient Details Name: Jessica Nash MRN: [id number] DOB: February 24, 1933 Today's Date: 05/04/2021   History of Present Illness Patient is an 87 year old female who presents to ED on 05/02/21 for increased generalized weakness,AMS and fatigue. Per son in law she had an almost fall a few days ago and twisted her ankle. Pt admitted to [facility] on 04/20/21 and discharged 04/26/21 for AMS, abdominal pain, and acute on chronic respiratory failure. Pt recently moved to [place] from [place]. Per daughter, pt was hospitalized in [place] 4 weeks ago for hypoxia and was placed on oxygen. Significant PMH includes: chronic respiratory failure (on 2L at baseline), CAD on dual antiplatelet therapy, legally blind, MI, HTN, HLD, DM, and CAD. X ray found to have bimalleloar fracture with some widenin gof the tibiofibular joint without true dilocation, per attending is partial weightbearing.    PT Comments    Pt received supine in bed, agreeable to therapy. She appeared lethargic and required frequent cueing to maintain attention; PT provided tactile cueing and repeated questions on multiple occasions. Bed mobility improved in today's session. She was able to maintain sitting balance for 7 minutes as PT assisted pt with feeding. Sitting duration limited by pain in R knee/ankle. Therex were attempted in sitting and supine with pt not responding - unsure if due to confusion or lethargy. At end of session, PT could not find call bell - RN notified. Would benefit from skilled PT to address above deficits and promote optimal return to PLOF.   Recommendations for follow up therapy are one component of a multi-disciplinary discharge planning process, led by the attending physician.  Recommendations may be updated based on patient status, additional functional criteria and insurance authorization.  Follow Up Recommendations  Skilled nursing-short term rehab (<3 hours/day)     Assistance Recommended at Discharge Frequent or  constant Supervision/Assistance  Equipment Recommendations  Other (comment) (TBD at next venue of care)    Recommendations for Other Services OT consult     Precautions / Restrictions Precautions Precautions: Fall;Other (comment) Precaution Comments: legally blind,  bimalleloar fracture RLE Required Braces or Orthoses: Splint/Cast Splint/Cast: R ankle Restrictions Weight Bearing Restrictions: Yes RLE Weight Bearing: Non weight bearing RLE Partial Weight Bearing Percentage or Pounds: per ortho consult on 12/7 - [facility]     Mobility  Bed Mobility Overal bed mobility: Needs Assistance Bed Mobility: Supine to Sit;Sit to Supine     Supine to sit: Mod assist;HOB elevated Sit to supine: Max assist   General bed mobility comments: MOD A to manage BLE; MAX A to manage BLE and trunk. MAX A to reposition in bed. Pt was able to scoot forward towards EOB for better sitting posture with CGA for trunk stability.    Transfers                   General transfer comment: deferred - pt lethargic, PT not confident pt could follow WB restrictions    Ambulation/Gait                   Stairs             Wheelchair Mobility    Modified Rankin (Stroke Patients Only)       Balance Overall balance assessment: Needs assistance Sitting-balance support: Bilateral upper extremity supported;Feet unsupported Sitting balance-Leahy Scale: Fair Sitting balance - Comments: Pt remained sitting EOB for 7 minutes as PT assisted with feeding. No LOB.       Standing balance comment: unable  to assess                            Cognition Arousal/Alertness: Lethargic Behavior During Therapy: Flat affect Overall Cognitive Status: No family/caregiver present to determine baseline cognitive functioning                               Problem Solving: Slow processing General Comments: PT required to repeat question multiple times for pt to respond         Exercises      General Comments        Pertinent Vitals/Pain Pain Assessment: Faces Faces Pain Scale: Hurts even more Pain Location: R knee and lower leg Pain Descriptors / Indicators: Aching;Discomfort Pain Intervention(s): Limited activity within patient's tolerance;Monitored during session;Patient requesting pain meds-RN notified;Repositioned;Premedicated before session    Home Living                          Prior Function            PT Goals (current goals can now be found in the care plan section) Acute Rehab PT Goals PT Goal Formulation: Patient unable to participate in goal setting    Frequency    7X/week      PT Plan      Co-evaluation              AM-PAC PT "6 Clicks" Mobility   Outcome Measure  Help needed turning from your back to your side while in a flat bed without using bedrails?: A Lot Help needed moving from lying on your back to sitting on the side of a flat bed without using bedrails?: A Lot Help needed moving to and from a bed to a chair (including a wheelchair)?: Total Help needed standing up from a chair using your arms (e.g., wheelchair or bedside chair)?: Total Help needed to walk in hospital room?: Total Help needed climbing 3-5 steps with a railing? : Total 6 Click Score: 8    End of Session Equipment Utilized During Treatment: Oxygen (3 L of 02) Activity Tolerance: Patient tolerated treatment well;Patient limited by lethargy Patient left: in bed;with call bell/phone within reach;with bed alarm set Nurse Communication: Mobility status;Weight bearing status;Patient requests pain meds PT Visit Diagnosis: Unsteadiness on feet (R26.81);Other abnormalities of gait and mobility (R26.89);Muscle weakness (generalized) (M62.81);Difficulty in walking, not elsewhere classified (R26.2);Pain Pain - Right/Left: Right Pain - part of body: Leg;Ankle and joints of foot     Time: 2956-2130 PT Time Calculation (min) (ACUTE ONLY): 26  min  Charges:  $Therapeutic Activity: 8-22 mins $Neuromuscular Re-education: 8-22 mins                     Patrina Levering PT, DPT 05/04/21 12:30 PM [phone]

## 2021-05-04 NOTE — Progress Notes (Signed)
   05/04/21 2000  Clinical Encounter Type  Visited With Patient  Visit Type Initial  Referral From Nurse;Chaplain  Consult/Referral To [employer] did a FU visit as requested by prior chaplain, form a spiritual consult. PT was sound asleep , so chaplain ministered with prayer at PT's door.   Andee Poles, MDiv

## 2021-05-04 NOTE — Progress Notes (Signed)
Initial Nutrition Assessment  DOCUMENTATION CODES:   Not applicable  INTERVENTION:   -Magic cup TID with meals, each supplement provides 290 kcal and 9 grams of protein  -30 ml Prosource Plus BID, each supplement provides 100 kcals and 15 grams protein -MVI with minerals daily -Feeding assistance with meals  NUTRITION DIAGNOSIS:   Inadequate oral intake related to lethargy/confusion as evidenced by meal completion < 25%.  GOAL:   Patient will meet greater than or equal to 90% of their needs  MONITOR:   PO intake, Supplement acceptance, Labs, Weight trends, Skin, I & O's  REASON FOR ASSESSMENT:   Malnutrition Screening Tool    ASSESSMENT:   Jessica Nash is a 87 y.o. female with medical history significant of hypertension, hyperlipidemia, diabetes mellitus, GERD, depression, former smoker, CAD, diverticulitis, breast cancer, bilateral blindness, CKD-3A, who presents with altered mental status, fall, bilateral ankle pain.  Pt admitted with acute metabolic encephalopathy.    Pt lying in bed at time of visit. She was not very interactive with this RD. She replied "help me; I'm sick" when RD introduced herself but otherwise did not respond to RD questions.   Noted breakfast tray at bedside, which was untouched,   Reviewed wt hx; noted distant history of weight loss.   Medications reviewed and include calcium with vitamin D and vitamin D3.  Lab Results  Component Value Date   HGBA1C 7.0 (H) 04/20/2021   PTA DM medications are 5 mg glucotrol daily.   Labs reviewed: CBGS: 73-131 (inpatient orders for glycemic control are 0-9 units insulin aspart every 4 hours).    NUTRITION - FOCUSED PHYSICAL EXAM:  Flowsheet Row Most Recent Value  Orbital Region No depletion  Upper Arm Region No depletion  Thoracic and Lumbar Region No depletion  Buccal Region No depletion  Temple Region No depletion  Clavicle Bone Region No depletion  Clavicle and Acromion Bone Region No depletion   Scapular Bone Region No depletion  Dorsal Hand No depletion  Patellar Region No depletion  Anterior Thigh Region No depletion  Posterior Calf Region No depletion  Edema (RD Assessment) None  Hair Reviewed  Eyes Reviewed  Mouth Reviewed  Skin Reviewed  Nails Reviewed       Diet Order:   Diet Order             Diet heart healthy/carb modified Room service appropriate? Yes; Fluid consistency: Thin  Diet effective now                   EDUCATION NEEDS:   No education needs have been identified at this time  Skin:  Skin Assessment: Reviewed RN Assessment  Last BM:  Unknown  Height:   Ht Readings from Last 1 Encounters:  04/22/21 5\' 2"  (1.575 m)    Weight:   Wt Readings from Last 1 Encounters:  04/23/21 87.6 kg    Ideal Body Weight:  50 kg  BMI:  There is no height or weight on file to calculate BMI.  Estimated Nutritional Needs:   Kcal:  1696-7893  Protein:  100-115 grams  Fluid:  > 1.7 L    Loistine Chance, RD, LDN, [place] Registered Dietitian II Certified Diabetes Care and Education Specialist Please refer to [facility] for RD and/or RD on-call/weekend/after hours pager

## 2021-05-05 DIAGNOSIS — G9341 Metabolic encephalopathy: Secondary | ICD-10-CM | POA: Diagnosis not present

## 2021-05-05 LAB — GLUCOSE, CAPILLARY
Glucose-Capillary: 86 mg/dL (ref 70–99)
Glucose-Capillary: 103 mg/dL — ABNORMAL HIGH (ref 70–99)

## 2021-05-05 MED ORDER — ACETAMINOPHEN 325 MG PO TABS: 650.0000 mg | ORAL_TABLET | Freq: Four times a day (QID) | ORAL | Status: AC | PRN

## 2021-05-05 MED ORDER — PROSOURCE PLUS PO LIQD: 30.0000 mL | Freq: Two times a day (BID) | ORAL | Status: AC

## 2021-05-05 MED ORDER — SENNOSIDES-DOCUSATE SODIUM 8.6-50 MG PO TABS: 2.0000 | ORAL_TABLET | Freq: Every day | ORAL | Status: DC

## 2021-05-05 MED ORDER — POLYETHYLENE GLYCOL 3350 17 G PO PACK: 17.0000 g | PACK | Freq: Every day | ORAL | 0 refills | Status: AC | PRN

## 2021-05-05 MED ORDER — POLYETHYLENE GLYCOL 3350 17 G PO PACK: 17.0000 g | PACK | Freq: Every day | ORAL | Status: DC | PRN

## 2021-05-05 MED ORDER — SENNOSIDES-DOCUSATE SODIUM 8.6-50 MG PO TABS: 2.0000 | ORAL_TABLET | Freq: Every day | ORAL | Status: AC

## 2021-05-05 MED ORDER — OLANZAPINE 2.5 MG PO TABS: 2.5000 mg | ORAL_TABLET | Freq: Two times a day (BID) | ORAL | Status: AC | PRN

## 2021-05-05 MED ORDER — ADULT MULTIVITAMIN W/MINERALS CH: 1.0000 | ORAL_TABLET | Freq: Every day | ORAL | Status: AC

## 2021-05-05 MED ORDER — OXYCODONE-ACETAMINOPHEN 5-325 MG PO TABS: 1.0000 | ORAL_TABLET | ORAL | 0 refills | Status: AC | PRN

## 2021-05-05 MED ORDER — ENOXAPARIN SODIUM 40 MG/0.4ML IJ SOSY
40.0000 mg | PREFILLED_SYRINGE | INTRAMUSCULAR | Status: AC
Start: 2021-05-05 — End: ?

## 2021-05-05 NOTE — TOC Progression Note (Signed)
Transition of Care [facility]) - Progression Note    Patient Details  Name: Jessica Nash MRN: [id number] Date of Birth: [birth year]  Transition of Care [facility]) CM/SW [place], RN Phone Number: 05/05/2021, 9:35 AM  Clinical Narrative:  Text [name] from [facility] to inquire about discharge room assignment.     Expected Discharge Plan: [place] Barriers to Discharge: Continued Medical Work up  Expected Discharge Plan and Services Expected Discharge Plan: [place]   Discharge Planning Services: CM Consult Post Acute Care Choice: [place] Living arrangements for the past 2 months: Single Family Home Expected Discharge Date: 05/05/21               DME Arranged: N/A DME Agency: NA       HH Arranged: NA HH Agency: NA         Social Determinants of Health (SDOH) Interventions    Readmission Risk Interventions No flowsheet data found.

## 2021-05-05 NOTE — TOC Transition Note (Signed)
Transition of Care [place]) - CM/SW Discharge Note   Patient Details  Name: Jessica Nash MRN: [id number] Date of Birth: 09-08-1932  Transition of Care [facility]) CM/SW Contact:  Kerin Salen, RN Phone Number: 05/05/2021, 12:33 PM   Clinical Narrative:  DVT prophylaxis order given to [name] at [facility] via text.     Final next level of care: Skilled Nursing Facility Barriers to Discharge: Barriers Resolved   Patient Goals and CMS Choice Patient states their goals for this hospitalization and ongoing recovery are:: To SNF CMS Medicare.gov Compare Post Acute Care list provided to:: Other (Comment Required) (Daughter) Choice offered to / list presented to : Adult Children [name])  Discharge Placement              Patient chooses bed at: [facility]  (Room 606A) Patient to be transferred to facility by: [place] Name of family member notified: Daughter [name] Patient and family notified of of transfer: 05/04/21  Discharge Plan and Services   Discharge Planning Services: CM Consult Post Acute Care Choice: [place]          DME Arranged: N/A DME Agency: NA       HH Arranged: NA HH Agency: NA        Social Determinants of Health (SDOH) Interventions     Readmission Risk Interventions No flowsheet data found.

## 2021-05-05 NOTE — Discharge Summary (Addendum)
Jessica Nash XAJ:[id number] DOB: 04/30/1933 DOA: 05/03/2021  PCP: System, Provider Not In  Admit date: 05/03/2021 Discharge date: 05/05/2021  Admitted From: home Disposition:  home  Recommendations for Outpatient Follow-up:  Follow up with PCP in 1 week Please obtain BMP/CBC in one week Follow up with orthopedics in 5 days       Discharge Condition:Stable CODE STATUS:DNR  Diet recommendation: Heart Healthy / Carb Modified  Magic cup TID with meals, each supplement provides 290 kcal and 9 grams of protein  -30 ml Prosource Plus BID, each supplement provides 100 kcals and 15 grams protein -MVI with minerals daily -Feeding assistance with meals  Brief/Interim Summary: Per HPI: Jessica Nash is a 87 y.o. female with medical history significant of hypertension, hyperlipidemia, diabetes mellitus, GERD, depression, former smoker, CAD, diverticulitis, breast cancer, bilateral blindness, CKD-3A, who presented with altered mental status, fall, bilateral ankle pain.  Patient also becomes altered and agitated, screaming.Psychiatry was consulted and was started on Zyprexa prn. Patient has calmed down .    Fall and ankle fracture, bimalleolar, closed: Patient has a bilateral ankle fracture.  Daughter does not want patient to do any surgery.   Ortho input was appreciated.  Recommend: Non weight bearing and conservative treatment Keep splint clean and dry and will change to a cast in 5 to 7 days once the swelling has improved. Continue with elevation and pain control     Acute metabolic encephalopathy: Etiology is not clear.  CT head is negative.  No focal deficit on physical examination.  Differential diagnosis include delirium, severe pain due to ankle fracture, early stage of dementia.  Per her daughter, patient had history of hypercapnic respiratory failure in the past.   VBG not much disturbance  Reorientation Fall /delirium precautions Pain control    Type II diabetes mellitus with renal  manifestations [name]): Recent A1c 7.0.   Continue RISS Monitor checking BCG 4x/day Once increase po intake, can resume her Glucotrol 5mg  po daily    HLD (hyperlipidemia) On statin   Hypertension Stable , continue current management   CAD (coronary artery disease) and elevated troponin:  2/2 demand ischemia Asa, statin, imdur     Hx of Breast cancer (HCC) -Continue anastrozole   Blind -Fall precaution   Chronic Hypercapnic respiratory failure (HCC) -Bronchodilators   CKD (chronic kidney disease), stage IIIa: Stable. Check labs periodically, f/u with pcp         Discharge Diagnoses:  Principal Problem:   Acute metabolic encephalopathy Active Problems:   Type II diabetes mellitus with renal manifestations (HCC)   HLD (hyperlipidemia)   Hypertension   CAD (coronary artery disease)   Breast cancer (HCC)   Blind   Hypercapnic respiratory failure (HCC)   Elevated troponin   CKD (chronic kidney disease), stage IIIa   Fall   Ankle fracture, bimalleolar, closed    Discharge Instructions  Discharge Instructions     Call MD for:  temperature >100.4   Complete by: As directed    Diet - low sodium heart healthy   Complete by: As directed    Diet - low sodium heart healthy   Complete by: As directed    Increase activity slowly   Complete by: As directed    Increase activity slowly   Complete by: As directed       Allergies as of 05/05/2021       Reactions   Lactose Intolerance (gi) Nausea And Vomiting        Medication List  STOP taking these medications    glipiZIDE 5 MG tablet Commonly known as: GLUCOTROL   traMADol 50 MG tablet Commonly known as: ULTRAM       TAKE these medications    (feeding supplement) PROSource Plus liquid Take 30 mLs by mouth 2 (two) times daily between meals.   acetaminophen 325 MG tablet Commonly known as: TYLENOL Take 2 tablets (650 mg total) by mouth every 6 (six) hours as needed for mild pain or fever.    amLODipine 5 MG tablet Commonly known as: NORVASC Take 5 mg by mouth daily.   anastrozole 1 MG tablet Commonly known as: ARIMIDEX Take 1 mg by mouth daily.   aspirin 81 MG tablet Take 81 mg by mouth daily.   atorvastatin 40 MG tablet Commonly known as: LIPITOR Take 40 mg by mouth at bedtime.   brimonidine-timolol 0.2-0.5 % ophthalmic solution Commonly known as: COMBIGAN Place 1 drop into both eyes every 12 (twelve) hours.   calcium-vitamin D 500-200 MG-UNIT tablet Take 1 tablet by mouth 2 (two) times daily with a meal.   cholecalciferol 25 MCG (1000 UNIT) tablet Commonly known as: VITAMIN D3 Take 1,000 Units by mouth daily.   enoxaparin 40 MG/0.4ML injection Commonly known as: LOVENOX Inject 0.4 mLs (40 mg total) into the skin daily.   famotidine 40 MG tablet Commonly known as: PEPCID Take 40 mg by mouth daily.   furosemide 20 MG tablet Commonly known as: Lasix Take 1 tablet (20 mg total) by mouth daily as needed for fluid or edema.   gabapentin 300 MG capsule Commonly known as: NEURONTIN Take 300 mg by mouth at bedtime.   isosorbide dinitrate 30 MG tablet Commonly known as: ISORDIL Take 30 mg by mouth daily.   latanoprost 0.005 % ophthalmic solution Commonly known as: XALATAN 1 drop at bedtime.   metoprolol tartrate 100 MG tablet Commonly known as: LOPRESSOR TAKE 1 TABLET BY MOUTH TWICE A DAY   multivitamin with minerals Tabs tablet Take 1 tablet by mouth daily. Start taking on: May 06, 2021   OLANZapine 2.5 MG tablet Commonly known as: ZYPREXA Take 1 tablet (2.5 mg total) by mouth 2 (two) times daily as needed (agitation).   oxyCODONE-acetaminophen 5-325 MG tablet Commonly known as: PERCOCET/ROXICET Take 1 tablet by mouth every 4 (four) hours as needed for up to 3 days for moderate pain.   polyethylene glycol 17 g packet Commonly known as: MIRALAX / GLYCOLAX Take 17 g by mouth daily as needed for mild constipation.   QUEtiapine 25 MG  tablet Commonly known as: SEROQUEL Take 1 tablet (25 mg total) by mouth at bedtime.   senna-docusate 8.6-50 MG tablet Commonly known as: Senokot-S Take 2 tablets by mouth daily.        Contact information for follow-up providers     Lovell Sheehan, MD Follow up.   Specialty: Orthopedic Surgery Why: Facillity to make follow up appt Contact information: [place] [address] [phone]              Contact information for after-discharge care     Destination     [facility] .   Service: Skilled Nursing Contact information: [place] [address] [phone]                    Allergies  Allergen Reactions   Lactose Intolerance (Gi) Nausea And Vomiting    Consultations: Orthopedics, psychiatry   Procedures/Studies: CT  ABDOMEN PELVIS WO CONTRAST  Result Date: 04/24/2021 CLINICAL DATA:  Abdominal pain.  Altered mental status. EXAM: CT ABDOMEN AND PELVIS WITHOUT CONTRAST TECHNIQUE: Multidetector CT imaging of the abdomen and pelvis was performed following the standard protocol without IV contrast. COMPARISON:  11/03/2009 FINDINGS: Lower chest: Small right pleural effusion and mild right basilar atelectasis. Hepatobiliary: No mass visualized on this unenhanced exam. Probable tiny gallstones noted, however there is no evidence of cholecystitis or biliary ductal dilatation. Pancreas: No mass or inflammatory process visualized on this unenhanced exam. Spleen:  Within normal limits in size. Adrenals/Urinary tract: A left adrenal mass is again seen which contains an internal focus of fat and calcification. This measures 3.3 x 2.2 cm, and is consistent with a benign myelolipoma. Moderate diffuse right renal parenchymal atrophy is seen. Several fluid attenuation cysts are noted in both kidneys. Several tiny less than 5 mm calcifications are seen in both renal hila, which may represent  vascular calcifications versus tiny nonobstructing calculi. No evidence of ureteral calculi or hydronephrosis. Unremarkable unopacified urinary bladder. Stomach/Bowel: No evidence of obstruction, inflammatory process, or abnormal fluid collections. Normal appendix visualized. Diffuse colonic diverticulosis is seen, however there is no evidence of diverticulitis. Vascular/Lymphatic: No pathologically enlarged lymph nodes identified. No evidence of abdominal aortic aneurysm. Aortic atherosclerotic calcification noted. Reproductive: Prior hysterectomy noted. Adnexal regions are unremarkable in appearance. Other:  None. Musculoskeletal: No suspicious bone lesions identified. Advanced lumbar degenerative spondylosis noted. IMPRESSION: Tiny nonobstructing bilateral renal calculi and vascular calcifications. No evidence of ureteral calculi, hydronephrosis, or other acute findings. Colonic diverticulosis, without radiographic evidence of diverticulitis. Probable cholelithiasis. No radiographic evidence of cholecystitis. Benign left adrenal myelolipoma. Small right pleural effusion and mild right basilar atelectasis. Electronically Signed   By: Marlaine Hind M.D.   On: 04/24/2021 14:23   DG Ankle Complete Right  Result Date: 05/03/2021 CLINICAL DATA:  Recent fall 2 days ago with ankle pain, initial encounter EXAM: RIGHT ANKLE - COMPLETE 3+ VIEW COMPARISON:  None. FINDINGS: Oblique fracture through the distal fibula is noted with minimal displacement. Small bony density is noted adjacent to the tip of the medial malleolus which may represent mild avulsion. Widening of the tibiotalar joint is noted without significant dislocation. No other abnormality is noted. IMPRESSION: Bimalleolar fracture with some widening of the tibiotalar joint without true dislocation. Electronically Signed   By: Inez Catalina M.D.   On: 05/03/2021 02:30   CT Head Wo Contrast  Result Date: 05/03/2021 CLINICAL DATA:  Generalized weakness EXAM: CT  HEAD WITHOUT CONTRAST TECHNIQUE: Contiguous axial images were obtained from the base of the skull through the vertex without intravenous contrast. COMPARISON:  04/20/2021 FINDINGS: Brain: There is no mass, hemorrhage or extra-axial collection. There is generalized atrophy without lobar predilection. Hypodensity of the white matter is most commonly associated with chronic microvascular disease. Vascular: No abnormal hyperdensity of the major intracranial arteries or dural venous sinuses. No intracranial atherosclerosis. Skull: The visualized skull base, calvarium and extracranial soft tissues are normal. Sinuses/Orbits: No fluid levels or advanced mucosal thickening of the visualized paranasal sinuses. No mastoid or middle ear effusion. Left globe calcification and volume loss. IMPRESSION: Generalized atrophy and chronic microvascular ischemia without acute intracranial abnormality. Electronically Signed   By: Ulyses Jarred M.D.   On: 05/03/2021 03:51   CT HEAD WO CONTRAST (5MM)  Result Date: 04/20/2021 CLINICAL DATA:  Mental status change, unknown cause. EXAM: CT HEAD WITHOUT CONTRAST TECHNIQUE: Contiguous axial images were obtained from the base of the skull through the  vertex without intravenous contrast. COMPARISON:  Head CT 07/04/2008. FINDINGS: Brain: Cerebral volume is unremarkable for age. Mild patchy and ill-defined hypoattenuation within the cerebral white matter, nonspecific but compatible with chronic small vessel ischemic disease. There is no acute intracranial hemorrhage. No demarcated cortical infarct. No extra-axial fluid collection. No evidence of an intracranial mass. No midline shift. Vascular: No hyperdense vessel.  Atherosclerotic calcifications. Skull: Normal. Negative for fracture or focal lesion. Sinuses/Orbits: Visualized orbits show no acute finding. Left phthisis bulbi. Mild mucosal thickening within the left frontal sinus. Mild mucosal thickening versus fluid within a posterior left  ethmoid air cell. IMPRESSION: No evidence of acute intracranial abnormality. Mild chronic small vessel ischemic changes within the cerebral white matter, progressed from the head CT of 07/04/2008. Paranasal sinus disease at the imaged levels, as described. Left phthisis bulbi. Electronically Signed   By: Kellie Simmering D.O.   On: 04/20/2021 15:00   MR BRAIN WO CONTRAST  Result Date: 04/24/2021 CLINICAL DATA:  Mental status change EXAM: MRI HEAD WITHOUT CONTRAST TECHNIQUE: Multiplanar, multiecho pulse sequences of the brain and surrounding structures were obtained without intravenous contrast. COMPARISON:  No prior MRI, correlation is made with CT head 04/20/2021 FINDINGS: Evaluation is somewhat limited by motion artifact. Brain: No restricted diffusion to suggest acute or subacute infarction. No acute hemorrhage, mass, mass effect, or midline shift. T2 hyperintense signal in the periventricular white matter, likely the sequela of chronic small vessel ischemic disease. No extra-axial collection or hydrocephalus. Vascular: Normal flow voids. Skull and upper cervical spine: Normal marrow signal. Sinuses/Orbits: Left phthisis bulbi. Status post right lens replacement. Mucosal thickening in the left ethmoid air cells. Other: None. IMPRESSION: No acute intracranial process. Electronically Signed   By: Merilyn Baba M.D.   On: 04/24/2021 22:08   DG Chest Portable 1 View  Result Date: 04/20/2021 CLINICAL DATA:  Hypoxia. Abdominal pain. Nausea. Evaluate infiltrate. EXAM: PORTABLE CHEST 1 VIEW COMPARISON:  01/15/2009 FINDINGS: Midline trachea. Cardiomegaly accentuated by AP portable technique. Probable small bilateral pleural effusions. No pneumothorax. Right greater than left lower lung predominant interstitial and airspace disease. IMPRESSION: Right greater than left interstitial and airspace disease is lower lung predominant. Pulmonary edema and/or infection. Probable small bilateral pleural effusions. Aortic  Atherosclerosis (ICD10-I70.0). Electronically Signed   By: Abigail Miyamoto M.D.   On: 04/20/2021 11:32   ECHOCARDIOGRAM COMPLETE  Result Date: 04/21/2021    ECHOCARDIOGRAM REPORT   Patient Name:   YOLINDA DUERR Date of Exam: 04/21/2021 Medical Rec #:  [id number]   Height:       65.0 in Accession #:    [id number]  Weight:       208.0 lb Date of Birth:  01-02-1933   BSA:          2.011 m Patient Age:    43 years    BP:           142/80 mmHg Patient Gender: F           HR:           109 bpm. Exam Location:  [facility] Procedure: 2D Echo, Cardiac Doppler and Color Doppler Indications:     CHF-acute diastolic X41.28  History:         Patient has no prior history of Echocardiogram examinations.                  CAD and Previous Myocardial Infarction; Risk  Factors:Hypertension.  Sonographer:     Sherrie Sport Referring Phys:  [id number] Diagnosing Phys: Ida Rogue MD  Sonographer Comments: No apical window, no subcostal window and Technically difficult study due to poor echo windows. Image acquisition challenging due to uncooperative patient. IMPRESSIONS  1. Left ventricular ejection fraction, by estimation, is 60 to 65%. The left ventricle has normal function. The left ventricle has no regional wall motion abnormalities. There is mild left ventricular hypertrophy. Left ventricular diastolic parameters are indeterminate.  2. Right ventricular systolic function is normal. The right ventricular size is normal.  3. The mitral valve was not well visualized. No evidence of mitral valve regurgitation. No evidence of mitral stenosis.  4. The aortic valve was not well visualized. Aortic valve regurgitation is not visualized. Aortic valve sclerosis is present, with no evidence of aortic valve stenosis.  5. The inferior vena cava is normal in size with greater than 50% respiratory variability, suggesting right atrial pressure of 3 mmHg. FINDINGS  Left Ventricle: Left ventricular ejection fraction, by  estimation, is 60 to 65%. The left ventricle has normal function. The left ventricle has no regional wall motion abnormalities. The left ventricular internal cavity size was normal in size. There is  mild left ventricular hypertrophy. Left ventricular diastolic parameters are indeterminate. Right Ventricle: The right ventricular size is normal. No increase in right ventricular wall thickness. Right ventricular systolic function is normal. Left Atrium: Left atrial size was normal in size. Right Atrium: Right atrial size was normal in size. Pericardium: There is no evidence of pericardial effusion. Mitral Valve: The mitral valve was not well visualized. No evidence of mitral valve stenosis. Tricuspid Valve: The tricuspid valve is not well visualized. No evidence of tricuspid stenosis. Aortic Valve: The aortic valve was not well visualized. Aortic valve sclerosis is present, with no evidence of aortic valve stenosis. Pulmonic Valve: The pulmonic valve was not well visualized. No evidence of pulmonic stenosis. Aorta: The aortic root is normal in size and structure. Venous: The inferior vena cava is normal in size with greater than 50% respiratory variability, suggesting right atrial pressure of 3 mmHg. IAS/Shunts: No atrial level shunt detected by color flow Doppler.  LEFT VENTRICLE PLAX 2D LVIDd:         4.50 cm LVIDs:         3.10 cm LV PW:         1.20 cm LV IVS:        1.00 cm LVOT diam:     2.00 cm LVOT Area:     3.14 cm  LEFT ATRIUM         Index LA diam:    3.90 cm 1.94 cm/m   AORTA Ao Root diam: 3.27 cm  SHUNTS Systemic Diam: 2.00 cm Ida Rogue MD Electronically signed by Ida Rogue MD Signature Date/Time: 04/21/2021/10:49:51 AM    Final       Subjective: C/o ankle pain.no dizziness, no sob, or any other complaitns  Discharge Exam: Vitals:   05/05/21 0753 05/05/21 0830  BP: 123/66   Pulse: 73   Resp: (!) 21   Temp: 97.6 F (36.4 C)   SpO2: 94% 90%   Vitals:   05/05/21 0418 05/05/21  0420 05/05/21 0753 05/05/21 0830  BP: 124/71 124/71 123/66   Pulse: 68 70 73   Resp: 20 20 (!) 21   Temp: 98.4 F (36.9 C) 98.4 F (36.9 C) 97.6 F (36.4 C)   TempSrc: Oral Oral Oral   SpO2:  96% 97% 94% 90%    General: Pt is alert, awake, not in acute distress, eyes closed Cardiovascular: RRR, S1/S2 +, no rubs, no gallops Respiratory: CTA bilaterally, no wheezing, no rhonchi Abdominal: Soft, NT, ND, bowel sounds + Extremities: no edema,    The results of significant diagnostics from this hospitalization (including imaging, microbiology, ancillary and laboratory) are listed below for reference.     Microbiology: Recent Results (from the past 240 hour(s))  Resp Panel by RT-PCR (Flu A&B, Covid) Nasopharyngeal Swab     Status: None   Collection Time: 05/03/21  3:50 AM   Specimen: Nasopharyngeal Swab; Nasopharyngeal(NP) swabs in vial transport medium  Result Value Ref Range Status   SARS Coronavirus 2 by RT PCR NEGATIVE NEGATIVE Final    Comment: (NOTE) SARS-CoV-2 target nucleic acids are NOT DETECTED.  The SARS-CoV-2 RNA is generally detectable in upper respiratory specimens during the acute phase of infection. The lowest concentration of SARS-CoV-2 viral copies this assay can detect is 138 copies/mL. A negative result does not preclude SARS-Cov-2 infection and should not be used as the sole basis for treatment or other patient management decisions. A negative result may occur with  improper specimen collection/handling, submission of specimen other than nasopharyngeal swab, presence of viral mutation(s) within the areas targeted by this assay, and inadequate number of viral copies(<138 copies/mL). A negative result must be combined with clinical observations, patient history, and epidemiological information. The expected result is Negative.  Fact Sheet for Patients:  EntrepreneurPulse.com.au  Fact Sheet for Healthcare Providers:   IncredibleEmployment.be  This test is no t yet approved or cleared by the [place] FDA and  has been authorized for detection and/or diagnosis of SARS-CoV-2 by FDA under an Emergency Use Authorization (EUA). This EUA will remain  in effect (meaning this test can be used) for the duration of the COVID-19 declaration under Section 564(b)(1) of the Act, 21 U.S.C.section 360bbb-3(b)(1), unless the authorization is terminated  or revoked sooner.       Influenza A by PCR NEGATIVE NEGATIVE Final   Influenza B by PCR NEGATIVE NEGATIVE Final    Comment: (NOTE) The Xpert Xpress SARS-CoV-2/FLU/RSV plus assay is intended as an aid in the diagnosis of influenza from Nasopharyngeal swab specimens and should not be used as a sole basis for treatment. Nasal washings and aspirates are unacceptable for Xpert Xpress SARS-CoV-2/FLU/RSV testing.  Fact Sheet for Patients: EntrepreneurPulse.com.au  Fact Sheet for Healthcare Providers: IncredibleEmployment.be  This test is not yet approved or cleared by the [place] FDA and has been authorized for detection and/or diagnosis of SARS-CoV-2 by FDA under an Emergency Use Authorization (EUA). This EUA will remain in effect (meaning this test can be used) for the duration of the COVID-19 declaration under Section 564(b)(1) of the Act, 21 U.S.C. section 360bbb-3(b)(1), unless the authorization is terminated or revoked.  Performed at [facility], [address] [place], [place] [address]      Labs: BNP (last 3 results) Recent Labs    04/20/21 1141 05/03/21 1759  BNP 454.3* 63.8   Basic Metabolic Panel: Recent Labs  Lab 05/02/21 1928  NA 138  K 4.5  CL 92*  CO2 39*  GLUCOSE 66*  BUN 22  CREATININE 1.25*  CALCIUM 10.0   Liver Function Tests: No results for input(s): AST, ALT, ALKPHOS, BILITOT, PROT, ALBUMIN in the last 168 hours. No results for input(s): LIPASE,  AMYLASE in the last 168 hours. No results for input(s): AMMONIA in the last 168  hours. CBC: Recent Labs  Lab 05/02/21 1928  WBC 6.3  HGB 12.9  HCT 42.5  MCV 101.0*  PLT 253   Cardiac Enzymes: No results for input(s): CKTOTAL, CKMB, CKMBINDEX, TROPONINI in the last 168 hours. BNP: Invalid input(s): POCBNP CBG: Recent Labs  Lab 05/04/21 1542 05/04/21 1949 05/04/21 2356 05/05/21 0423 05/05/21 0749  GLUCAP 123* 116* 105* 86 103*   D-Dimer No results for input(s): DDIMER in the last 72 hours. Hgb A1c Recent Labs    05/03/21 1759  HGBA1C 7.3*   Lipid Profile Recent Labs    05/04/21 0351  CHOL 108  HDL 30*  LDLCALC 47  TRIG 153*  CHOLHDL 3.6   Thyroid function studies No results for input(s): TSH, T4TOTAL, T3FREE, THYROIDAB in the last 72 hours.  Invalid input(s): FREET3 Anemia work up No results for input(s): VITAMINB12, FOLATE, FERRITIN, TIBC, IRON, RETICCTPCT in the last 72 hours. Urinalysis    Component Value Date/Time   COLORURINE YELLOW (A) 05/03/2021 0240   APPEARANCEUR CLEAR (A) 05/03/2021 0240   LABSPEC 1.014 05/03/2021 0240   PHURINE 7.0 05/03/2021 0240   GLUCOSEU NEGATIVE 05/03/2021 0240   HGBUR NEGATIVE 05/03/2021 0240   HGBUR large 07/14/2008 0928   BILIRUBINUR NEGATIVE 05/03/2021 0240   KETONESUR NEGATIVE 05/03/2021 0240   PROTEINUR NEGATIVE 05/03/2021 0240   UROBILINOGEN 0.2 07/14/2008 0928   NITRITE NEGATIVE 05/03/2021 0240   LEUKOCYTESUR NEGATIVE 05/03/2021 0240   Sepsis Labs Invalid input(s): PROCALCITONIN,  WBC,  LACTICIDVEN Microbiology Recent Results (from the past 240 hour(s))  Resp Panel by RT-PCR (Flu A&B, Covid) Nasopharyngeal Swab     Status: None   Collection Time: 05/03/21  3:50 AM   Specimen: Nasopharyngeal Swab; Nasopharyngeal(NP) swabs in vial transport medium  Result Value Ref Range Status   SARS Coronavirus 2 by RT PCR NEGATIVE NEGATIVE Final    Comment: (NOTE) SARS-CoV-2 target nucleic acids are NOT  DETECTED.  The SARS-CoV-2 RNA is generally detectable in upper respiratory specimens during the acute phase of infection. The lowest concentration of SARS-CoV-2 viral copies this assay can detect is 138 copies/mL. A negative result does not preclude SARS-Cov-2 infection and should not be used as the sole basis for treatment or other patient management decisions. A negative result may occur with  improper specimen collection/handling, submission of specimen other than nasopharyngeal swab, presence of viral mutation(s) within the areas targeted by this assay, and inadequate number of viral copies(<138 copies/mL). A negative result must be combined with clinical observations, patient history, and epidemiological information. The expected result is Negative.  Fact Sheet for Patients:  EntrepreneurPulse.com.au  Fact Sheet for Healthcare Providers:  IncredibleEmployment.be  This test is no t yet approved or cleared by the [place] FDA and  has been authorized for detection and/or diagnosis of SARS-CoV-2 by FDA under an Emergency Use Authorization (EUA). This EUA will remain  in effect (meaning this test can be used) for the duration of the COVID-19 declaration under Section 564(b)(1) of the Act, 21 U.S.C.section 360bbb-3(b)(1), unless the authorization is terminated  or revoked sooner.       Influenza A by PCR NEGATIVE NEGATIVE Final   Influenza B by PCR NEGATIVE NEGATIVE Final    Comment: (NOTE) The Xpert Xpress SARS-CoV-2/FLU/RSV plus assay is intended as an aid in the diagnosis of influenza from Nasopharyngeal swab specimens and should not be used as a sole basis for treatment. Nasal washings and aspirates are unacceptable for Xpert Xpress SARS-CoV-2/FLU/RSV testing.  Fact Sheet for Patients: EntrepreneurPulse.com.au  Fact Sheet for Healthcare Providers: IncredibleEmployment.be  This test is not yet  approved or cleared by the [place] FDA and has been authorized for detection and/or diagnosis of SARS-CoV-2 by FDA under an Emergency Use Authorization (EUA). This EUA will remain in effect (meaning this test can be used) for the duration of the COVID-19 declaration under Section 564(b)(1) of the Act, 21 U.S.C. section 360bbb-3(b)(1), unless the authorization is terminated or revoked.  Performed at [facility], [address], [place], [place] [address]      Time coordinating discharge: Over 30 minutes  SIGNED:   Nolberto Hanlon, MD  [facility] 05/05/2021, 10:18 AM Pager   If 7PM-7AM, please contact night-coverage [email or web address] Password [email or web address]

## 2021-05-05 NOTE — TOC Transition Note (Signed)
Transition of Care [facility]) - CM/SW Discharge Note   Patient Details  Name: Jessica Nash MRN: [id number] Date of Birth: 08/04/1932  [facility] CM/SW Contact:  Kerin Salen, RN Phone Number: 05/05/2021, 10:10 AM   Clinical Narrative: To discharge to [facility] room 606A via [facility] transport. Daughter notified yesterday and left message today with room number. Nurse to call report. TOC barriers resolved.      Final next level of care: Skilled Nursing Facility Barriers to Discharge: Barriers Resolved   Patient Goals and CMS Choice Patient states their goals for this hospitalization and ongoing recovery are:: To SNF CMS Medicare.gov Compare Post Acute Care list provided to:: Other (Comment Required) (Daughter) Choice offered to / list presented to : Adult Children [name])  Discharge Placement              Patient chooses bed at: [facility] [place] (Room 606A) Patient to be transferred to facility by: [place] Name of family member notified: Daughter [name] Patient and family notified of of transfer: 05/04/21  Discharge Plan and Services   Discharge Planning Services: CM Consult Post Acute Care Choice: [place]          DME Arranged: N/A DME Agency: NA       HH Arranged: NA HH Agency: NA        Social Determinants of Health (SDOH) Interventions     Readmission Risk Interventions No flowsheet data found.

## 2021-05-05 NOTE — Progress Notes (Signed)
Physical Therapy Treatment Patient Details Name: Jessica Nash MRN: [id number] DOB: February 28, 1933 Today's Date: 05/05/2021   History of Present Illness Patient is an 87 year old female who presents to ED on 05/02/21 for increased generalized weakness,AMS and fatigue. Per son in law she had an almost fall a few days ago and twisted her ankle. Pt admitted to [facility] on 04/20/21 and discharged 04/26/21 for AMS, abdominal pain, and acute on chronic respiratory failure. Pt recently moved to [place] from [place]. Per daughter, pt was hospitalized in [place] 4 weeks ago for hypoxia and was placed on oxygen. Significant PMH includes: chronic respiratory failure (on 2L at baseline), CAD on dual antiplatelet therapy, legally blind, MI, HTN, HLD, DM, and CAD. X ray found to have bimalleloar fracture with some widenin gof the tibiofibular joint without true dilocation, per attending is partial weightbearing.    PT Comments    Pt supine in bed, agreeable to therapy. Pt required increased assist today to achieve sitting EOB however she was able to maintain sitting for an increased duration and performed seated therex. Multimodal cueing required to follow exercise commands. Pt asking questions as to why her RLE is in pain - PT educated on bimalleolar fx and NWB status. She does appear motivated as she stated she would like to get out of the bed and into a chair - RN notifed and PT rec Hoyer lift for safety and to ensure NWB. Would benefit from skilled PT to address above deficits and promote optimal return to PLOF.    Recommendations for follow up therapy are one component of a multi-disciplinary discharge planning process, led by the attending physician.  Recommendations may be updated based on patient status, additional functional criteria and insurance authorization.  Follow Up Recommendations  Skilled nursing-short term rehab (<3 hours/day)     Assistance Recommended at Discharge Frequent or constant Supervision/Assistance   Equipment Recommendations  Other (comment) (TBD at next venue of care)    Recommendations for Other Services OT consult     Precautions / Restrictions Precautions Precautions: Fall;Other (comment) Precaution Comments: legally blind,  bimalleloar fracture RLE Required Braces or Orthoses: Splint/Cast Splint/Cast: R ankle Restrictions Weight Bearing Restrictions: Yes RLE Weight Bearing: Non weight bearing RLE Partial Weight Bearing Percentage or Pounds: per ortho consult on 12/7 - NWB     Mobility  Bed Mobility Overal bed mobility: Needs Assistance Bed Mobility: Supine to Sit;Sit to Supine     Supine to sit: Max assist Sit to supine: Max assist   General bed mobility comments: MAX A in both direction to manage BLE and trunk - decreased participation this date (possibly due to increased confusion). Pt was able to adjust hips to sit square to EOB.    Transfers                        Ambulation/Gait                   Stairs             Wheelchair Mobility    Modified Rankin (Stroke Patients Only)       Balance Overall balance assessment: Needs assistance Sitting-balance support: Bilateral upper extremity supported;Feet unsupported Sitting balance-Leahy Scale: Fair Sitting balance - Comments: Pt sat EOB for 15 minutes. No apparent LOB however occasional tactile and verbal cueing to lean forward.       Standing balance comment: unable to assess  Cognition Arousal/Alertness: Lethargic Behavior During Therapy: Flat affect Overall Cognitive Status: No family/caregiver present to determine baseline cognitive functioning                               Problem Solving: Slow processing General Comments: PT required to repeat question multiple times for pt to respond and provide tactile cueing/stimulation. Pt appearing confused.        Exercises General Exercises - Lower Extremity Ankle  Circles/Pumps: AROM;Left;10 reps;Seated Long Arc Quad: AROM;Strengthening;Both;20 reps;Seated Hip ABduction/ADduction: AROM;Strengthening;Both;10 reps Hip Flexion/Marching: AROM;Strengthening;Both;10 reps;Seated    General Comments        Pertinent Vitals/Pain Pain Assessment: No/denies pain Breathing: normal Negative Vocalization: none Facial Expression: smiling or inexpressive Body Language: relaxed Consolability: no need to console PAINAD Score: 0    Home Living                          Prior Function            PT Goals (current goals can now be found in the care plan section) Acute Rehab PT Goals PT Goal Formulation: Patient unable to participate in goal setting    Frequency    7X/week      PT Plan      Co-evaluation              AM-PAC PT "6 Clicks" Mobility   Outcome Measure  Help needed turning from your back to your side while in a flat bed without using bedrails?: A Lot Help needed moving from lying on your back to sitting on the side of a flat bed without using bedrails?: A Lot Help needed moving to and from a bed to a chair (including a wheelchair)?: Total Help needed standing up from a chair using your arms (e.g., wheelchair or bedside chair)?: Total Help needed to walk in hospital room?: Total Help needed climbing 3-5 steps with a railing? : Total 6 Click Score: 8    End of Session Equipment Utilized During Treatment: Oxygen (3 L of 02) Activity Tolerance: Patient tolerated treatment well;Patient limited by lethargy;Other (comment) (confusion - unsure if pt is able to understand and follow WB precautions) Patient left: in bed;with call bell/phone within reach;with bed alarm set Nurse Communication: Mobility status;Weight bearing status;Patient requests pain meds PT Visit Diagnosis: Unsteadiness on feet (R26.81);Other abnormalities of gait and mobility (R26.89);Muscle weakness (generalized) (M62.81);Difficulty in walking, not  elsewhere classified (R26.2);Pain Pain - Right/Left: Right Pain - part of body: Leg;Ankle and joints of foot     Time: 0233-4356 PT Time Calculation (min) (ACUTE ONLY): 26 min  Charges:  $Therapeutic Exercise: 8-22 mins $Therapeutic Activity: 8-22 mins                     Patrina Levering PT, DPT 05/05/21 11:03 AM [phone]

## 2021-05-05 NOTE — Progress Notes (Signed)
Notified MD last BM documented was 11/25. MD states pt still needs to DC and will add stool softner/laxitive. [phone]. Informed RN of LBM documented.

## 2021-05-23 ENCOUNTER — Emergency Department: Payer: Medicare Other

## 2021-05-23 ENCOUNTER — Emergency Department
Admission: EM | Admit: 2021-05-23 | Discharge: 2021-05-29 | Disposition: E | Payer: Medicare Other | Attending: Emergency Medicine | Admitting: Emergency Medicine

## 2021-05-23 DIAGNOSIS — I469 Cardiac arrest, cause unspecified: Secondary | ICD-10-CM

## 2021-05-23 DIAGNOSIS — I129 Hypertensive chronic kidney disease with stage 1 through stage 4 chronic kidney disease, or unspecified chronic kidney disease: Secondary | ICD-10-CM | POA: Insufficient documentation

## 2021-05-23 DIAGNOSIS — Z7982 Long term (current) use of aspirin: Secondary | ICD-10-CM | POA: Diagnosis not present

## 2021-05-23 DIAGNOSIS — I2510 Atherosclerotic heart disease of native coronary artery without angina pectoris: Secondary | ICD-10-CM | POA: Insufficient documentation

## 2021-05-23 DIAGNOSIS — Z79899 Other long term (current) drug therapy: Secondary | ICD-10-CM | POA: Diagnosis not present

## 2021-05-23 DIAGNOSIS — E1122 Type 2 diabetes mellitus with diabetic chronic kidney disease: Secondary | ICD-10-CM | POA: Insufficient documentation

## 2021-05-23 DIAGNOSIS — Z87891 Personal history of nicotine dependence: Secondary | ICD-10-CM | POA: Diagnosis not present

## 2021-05-23 DIAGNOSIS — N1831 Chronic kidney disease, stage 3a: Secondary | ICD-10-CM | POA: Insufficient documentation

## 2021-05-23 DIAGNOSIS — Z853 Personal history of malignant neoplasm of breast: Secondary | ICD-10-CM | POA: Insufficient documentation

## 2021-05-23 LAB — CBG MONITORING, ED: Glucose-Capillary: 268 mg/dL — ABNORMAL HIGH (ref 70–99)

## 2021-05-23 MED ORDER — SODIUM BICARBONATE 8.4 % IV SOLN
INTRAVENOUS | Status: DC | PRN
  Administered 2021-05-23 (×2): 50 meq via INTRAVENOUS

## 2021-05-23 MED ORDER — EPINEPHRINE 1 MG/10ML IJ SOSY
PREFILLED_SYRINGE | INTRAMUSCULAR | Status: DC | PRN
  Administered 2021-05-23 (×4): 1 mg via INTRAVENOUS

## 2021-05-29 NOTE — Code Documentation (Signed)
Lost pulses, CPR resumed. a

## 2021-05-29 NOTE — Code Documentation (Signed)
Pulse check, asystole, cpr resumed

## 2021-05-29 NOTE — Progress Notes (Addendum)
Noted in chart patient had been seen earlier in month for bilateral ankle fx resulting from a fall at home. Patient was discharged to a facility to due inability to weight bear(in part). Called K. Kenton Kingfisher, [place] on call. Stated he would initially consider ME case, but would need to research further if meets criteria and would update if not a ME case.

## 2021-05-29 NOTE — ED Provider Notes (Addendum)
[facility] Emergency Department Provider Note   ____________________________________________   Event Date/Time   First MD Initiated Contact with Patient 06-21-2021 1239     (approximate)  I have reviewed the triage vital signs and the nursing notes.   HISTORY  Chief Complaint Cardiac Arrest    HPI Jessica Nash is a 88 y.o. female patient brought by EMS for unwitnessed arrest .  Given 2 epis had return of circulation given 300 amiodarone and then became bradycardic into the 40s.  Got atropine 1 mg IV with no change in bradycardia lost pulses and CPR was begun again.  Patient arrived on [place] with Combitube in place.  Sats in the 70s and 80s.  Patient received 2 more epi is here and bicarb with no change we intubated the patient with a ET tube under direct vision 7 and half tube suction produced pink sputum.  Sats went up to the 100s briefly patient had return of circulation and then lost circulation again.  Patient received another round of epi's and 1 more bicarb with no return of circulation and CPR was continued and then paused briefly ultrasound showed no cardiac activity whatsoever.        Past Medical History:  Diagnosis Date   Blind 2011   Breast cancer [facility])    Breast cancer metastasized to axillary lymph node ([place]) 4/12   Papillary carcinoma. 2/3 sentinel nodes positive. Dr Bary Castilla   Breast cyst    CAD (coronary artery disease)    Cellulitis and abscess of trunk 2013   Degenerative disc disease    Diabetes mellitus    Diverticulitis    GERD (gastroesophageal reflux disease)    Glaucoma 2010   blindness d/t glaucoma   Hx of colonic polyps    Hx of decompressive lumbar laminectomy    3 Level lumbar decompressive laminotomies with  foraminotomies 02/06   Hyperlipidemia    Hypertension    Myocardial infarction [facility]) 2010   Personal history of tobacco use, presenting hazards to health     Patient Active Problem List   Diagnosis Date Noted    Acute metabolic encephalopathy 20/25/4270   Elevated troponin 05/03/2021   CKD (chronic kidney disease), stage IIIa 05/03/2021   Fall 05/03/2021   Ankle fracture, bimalleolar, closed 05/03/2021   Acute on chronic respiratory failure ([place]) 04/20/2021   Blind    Myalgia 06/19/2012   Blindness of both eyes 06/08/2011   Breast cancer ([place])    CONSTIPATION, CHRONIC 06/10/2010   DECREASED HEARING 08/16/2009   Acute lower UTI 07/14/2008   CAD (coronary artery disease) 07/07/2008   OSTEOARTHRITIS 06/12/2008   Myocardial infarction ([place]) 2010   Hypercapnic respiratory failure ([place]) 2010   HLD (hyperlipidemia) 02/15/2007   TUBULOVILLOUS ADENOMA, 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   Type II diabetes mellitus with renal manifestations ([place]) 10/09/2006   GLAUCOMA 10/09/2006   Hypertension 10/09/2006   GERD 10/09/2006   DEGENERATIVE [place] DISEASE, LUMBAR SPINE 10/09/2006   COLONIC POLYPS, HX OF 10/09/2006    Past Surgical History:  Procedure Laterality Date   ABDOMINAL HYSTERECTOMY  1964   partial   ANGIOPLASTY     2/10 Angioplasty/stent-RCA---------Dr Arida   BACK SURGERY     BREAST BIOPSY Left 2012   BREAST SURGERY Right 2011   biopsy   COLON SURGERY  2006   COLONOSCOPY  2008,2011   Dr. Candace Cruise [facility]   CORONARY ANGIOPLASTY WITH STENT PLACEMENT  2010   done by Annia Belt MD   EYE SURGERY  GLAUCOMA SURGERY  01/03-03/03   MASTECTOMY  4/12   Dr Kris Mouton RT after   MASTECTOMY Right 2012    Prior to Admission medications   Medication Sig Start Date End Date Taking? Authorizing Provider  acetaminophen (TYLENOL) 325 MG tablet Take 2 tablets (650 mg total) by mouth every 6 (six) hours as needed for mild pain or fever. 05/05/21   Nolberto Hanlon, MD  amLODipine (NORVASC) 5 MG tablet Take 5 mg by mouth daily.    [provider]  anastrozole (ARIMIDEX) 1 MG tablet Take 1 mg by mouth daily.      [provider]  aspirin 81 MG tablet Take 81 mg by mouth daily.      [provider]  atorvastatin (LIPITOR) 40 MG tablet Take 40 mg by mouth at bedtime.    [provider]  brimonidine-timolol (COMBIGAN) 0.2-0.5 % ophthalmic solution Place 1 drop into both eyes every 12 (twelve) hours.      [provider]  Calcium Carbonate-Vitamin D (CALCIUM-VITAMIN D) 500-200 MG-UNIT per tablet Take 1 tablet by mouth 2 (two) times daily with a meal.      [provider]  cholecalciferol (VITAMIN D3) 25 MCG (1000 UNIT) tablet Take 1,000 Units by mouth daily.    [provider]  enoxaparin (LOVENOX) 40 MG/0.4ML injection Inject 0.4 mLs (40 mg total) into the skin daily. 05/05/21   Nolberto Hanlon, MD  famotidine (PEPCID) 40 MG tablet Take 40 mg by mouth daily.    [provider]  furosemide (LASIX) 20 MG tablet Take 1 tablet (20 mg total) by mouth daily as needed for fluid or edema. 04/26/21 04/26/22  Caren Griffins, MD  gabapentin (NEURONTIN) 300 MG capsule Take 300 mg by mouth at bedtime.    [provider]  isosorbide dinitrate (ISORDIL) 30 MG tablet Take 30 mg by mouth daily.    [provider]  latanoprost (XALATAN) 0.005 % ophthalmic solution 1 drop at bedtime.    [provider]  metoprolol (LOPRESSOR) 100 MG tablet TAKE 1 TABLET BY MOUTH TWICE A DAY 06/25/12   Venia Carbon, MD  Multiple Vitamin (MULTIVITAMIN WITH MINERALS) TABS tablet Take 1 tablet by mouth daily. 05/06/21   Nolberto Hanlon, MD  Nutritional Supplements (,FEEDING SUPPLEMENT, PROSOURCE PLUS) liquid Take 30 mLs by mouth 2 (two) times daily between meals. 05/05/21   Nolberto Hanlon, MD  OLANZapine (ZYPREXA) 2.5 MG tablet Take 1 tablet (2.5 mg total) by mouth 2 (two) times daily as needed (agitation). 05/05/21   Nolberto Hanlon, MD  polyethylene glycol (MIRALAX / GLYCOLAX) 17 g packet Take 17 g by mouth daily as needed for mild constipation. 05/05/21   Nolberto Hanlon, MD  QUEtiapine (SEROQUEL) 25 MG tablet Take 1 tablet (25 mg total) by mouth at bedtime.  04/26/21 05/26/21  Caren Griffins, MD  senna-docusate (SENOKOT-S) 8.6-50 MG tablet Take 2 tablets by mouth daily. 05/05/21   Nolberto Hanlon, MD    Allergies Lactose intolerance (gi)  Family History  Problem Relation Age of Onset   Cancer Daughter        colon cancer   Cancer Son        colon cancer   Cancer Other        unknoen family members with ovarian and breast cancers    Social History Social History   Tobacco Use   Smoking status: Former    Years: 20.00    Types: Cigarettes    Quit date: 05/30/2007  Years since quitting: 13.9   Smokeless tobacco: Never  Substance Use Topics   Alcohol use: No   Drug use: No    Review of Systems Unobtainable due to cardiac arrest ____________________________________________   PHYSICAL EXAM:  VITAL SIGNS: ED Triage Vitals [06-02-21 1224]  Enc Vitals Group     BP (!) 170/46     Pulse Rate (!) 102     Resp (!) 24     Temp      Temp src      SpO2 100 %     Weight      Height      Head Circumference      [facility]      Pain Score      Pain Loc      Pain Edu?      Excl. in [place]?     Constitutional: Unresponsive Eyes: Left eye is scarred and white right eye pupil dilated Head: Atraumatic. Nose: No congestion/rhinnorhea. Mouth/Throat: Mucous membranes are moist.  Oropharynx non-erythematous. Neck: No stridor.  Cardiovascular: No cardiac activity although patient has a palpable pulse with compressions Respiratory: No spontaneous respirations although with bag-valve-mask there are good breath sounds bilaterally some crackles Gastrointestinal: Soft  Musculoskeletal: Lower leg cast on right leg no edema in the left leg Neurologic: Unresponsive and not moving Skin:  Skin is warm, dry and intact. No rash noted.   ____________________________________________   LABS (all labs ordered are listed, but only abnormal results are displayed)  Labs Reviewed  CBG MONITORING, ED - Abnormal; Notable for the following components:       Result Value   Glucose-Capillary 268 (*)    All other components within normal limits   ____________________________________________  EKG   ____________________________________________  RADIOLOGY Gertha Calkin, personally viewed and evaluated these images (plain radiographs) as part of my medical decision making, as well as reviewing the written report by the radiologist.  ED MD interpretation:    Official radiology report(s): No results found.  ____________________________________________   PROCEDURES  Procedure(s) performed (including Critical Care):  Procedures   ____________________________________________   INITIAL IMPRESSION / ASSESSMENT AND PLAN / ED COURSE  Patient intubated and suctioned pink material was obtained from the ET tube lung showed multiple comet tails consistent with fluid.  Ultrasound of the heart after 2 bicarbs 1 atropine 300 amiodarone by EMS and EMS is and are epis showed no cardiac activity whatsoever.  O2 sats were slowly dropping.  Patient was intubated by me earlier using glide scope.  ET tube was visualized going through the cords with good color change on the colorimetric monitor.  Good bilateral breath sounds on exam.  O2 sats did go up to 100 for short period and then dropped material was suctioned out sats went back up and then dropped slowly.  Breath sounds remain equal bilaterally.  Fingerstick showed glucose of 263.  Patient was not cold patient had gotten approximately a total of a liter out of 3 different bags of IV fluid.  Unable to obtain electrolytes.  I called the code at 1236 due to lack of cardiopulmonary responsiveness.              ____________________________________________   FINAL CLINICAL IMPRESSION(S) / ED DIAGNOSES  Final diagnoses:  Cardiac arrest [facility])     ED Discharge Orders     None        Note:  This document was prepared using Dragon voice recognition software and may include  unintentional dictation errors.    Nena Polio, MD 06/06/2021 1258  Spoke with the nurse practitioner at [facility] office.  She told me that the patient falling out of bed last night but appeared to be fine.  They had done a COVID test about 20 minutes prior to the patient's arrest as several people on the floor had tested positive earlier.   Nena Polio, MD June 06, 2021 1300

## 2021-05-29 NOTE — Code Documentation (Signed)
7.5ETT at 23. +color change

## 2021-05-29 NOTE — Code Documentation (Signed)
Pulse check with ROSC.  

## 2021-05-29 NOTE — Progress Notes (Signed)
°   2021-05-30 1319  Clinical Encounter Type  Visited With Patient and family together  Visit Type Initial;Spiritual support  Referral From Nurse  Consult/Referral To Chaplain  Spiritual Encounters  Spiritual Needs Prayer;Grief support;Emotional   Chaplain Carlton Sweaney responded to a death in ED-26 Pt, Jessica Nash. Pt's daughter is by her bedside. I provided comforting words, grief support followed by prayer.

## 2021-05-29 NOTE — Code Documentation (Signed)
Patient time of death occurred at 09-Sep-1234.

## 2021-05-29 NOTE — ED Triage Notes (Signed)
Pt bib ems from facility, unwitnessed arrest. Given 2 epi, then rosc, given 300mg  amiodarone. Theb given 1mg  atropine given for HR 40 with no change in HR, lost pulses. CPR in progress on arrival.

## 2021-05-29 DEATH — deceased
